# Patient Record
Sex: Male | Born: 1975 | Race: Black or African American | Hispanic: No | Marital: Single | Smoking: Current every day smoker
Health system: Southern US, Community
[De-identification: ages and names within clinical notes are randomized; demographics above are authoritative.]

## PROBLEM LIST (undated history)

## (undated) DIAGNOSIS — S2231XA Fracture of one rib, right side, initial encounter for closed fracture: Secondary | ICD-10-CM

## (undated) DIAGNOSIS — R51 Headache: Secondary | ICD-10-CM

## (undated) DIAGNOSIS — G8929 Other chronic pain: Secondary | ICD-10-CM

## (undated) DIAGNOSIS — N200 Calculus of kidney: Secondary | ICD-10-CM

## (undated) HISTORY — DX: Calculus of kidney: N20.0

## (undated) HISTORY — DX: Other chronic pain: G89.29

## (undated) HISTORY — DX: Fracture of one rib, right side, initial encounter for closed fracture: S22.31XA

## (undated) HISTORY — DX: Headache: R51

---

## 1997-12-11 DIAGNOSIS — S2231XA Fracture of one rib, right side, initial encounter for closed fracture: Secondary | ICD-10-CM

## 1997-12-11 HISTORY — PX: PELVIC FRACTURE SURGERY: SHX119

## 1997-12-11 HISTORY — DX: Fracture of one rib, right side, initial encounter for closed fracture: S22.31XA

## 1998-07-17 ENCOUNTER — Inpatient Hospital Stay (HOSPITAL_COMMUNITY): Admission: EM | Admit: 1998-07-17 | Discharge: 1998-08-04 | Payer: Self-pay | Admitting: Emergency Medicine

## 1998-07-27 ENCOUNTER — Encounter: Payer: Self-pay | Admitting: Pulmonary Disease

## 1998-07-29 ENCOUNTER — Encounter: Payer: Self-pay | Admitting: Surgery

## 1998-08-04 ENCOUNTER — Inpatient Hospital Stay
Admission: EM | Admit: 1998-08-04 | Discharge: 1998-08-13 | Payer: Self-pay | Admitting: Physical Medicine and Rehabilitation

## 1998-08-06 ENCOUNTER — Encounter: Payer: Self-pay | Admitting: Physical Medicine and Rehabilitation

## 1998-08-07 ENCOUNTER — Encounter: Payer: Self-pay | Admitting: Orthopedic Surgery

## 1998-08-13 ENCOUNTER — Inpatient Hospital Stay (HOSPITAL_COMMUNITY)
Admission: RE | Admit: 1998-08-13 | Discharge: 1998-08-27 | Payer: Self-pay | Admitting: Physical Medicine and Rehabilitation

## 1998-09-06 ENCOUNTER — Encounter (HOSPITAL_COMMUNITY): Admission: RE | Admit: 1998-09-06 | Discharge: 1998-12-05 | Payer: Self-pay | Admitting: *Deleted

## 1998-09-13 ENCOUNTER — Encounter: Admission: RE | Admit: 1998-09-13 | Discharge: 1998-12-12 | Payer: Self-pay | Admitting: Orthopedic Surgery

## 2000-08-02 ENCOUNTER — Emergency Department (HOSPITAL_COMMUNITY): Admission: EM | Admit: 2000-08-02 | Discharge: 2000-08-02 | Payer: Self-pay | Admitting: Emergency Medicine

## 2002-02-04 ENCOUNTER — Other Ambulatory Visit: Admission: RE | Admit: 2002-02-04 | Discharge: 2002-02-04 | Payer: Self-pay | Admitting: General Surgery

## 2003-02-09 ENCOUNTER — Encounter: Payer: Self-pay | Admitting: *Deleted

## 2003-02-09 ENCOUNTER — Emergency Department (HOSPITAL_COMMUNITY): Admission: EM | Admit: 2003-02-09 | Discharge: 2003-02-09 | Payer: Self-pay | Admitting: *Deleted

## 2003-03-01 ENCOUNTER — Emergency Department (HOSPITAL_COMMUNITY): Admission: EM | Admit: 2003-03-01 | Discharge: 2003-03-01 | Payer: Self-pay | Admitting: Emergency Medicine

## 2003-03-21 ENCOUNTER — Emergency Department (HOSPITAL_COMMUNITY): Admission: EM | Admit: 2003-03-21 | Discharge: 2003-03-21 | Payer: Self-pay | Admitting: Emergency Medicine

## 2005-08-28 ENCOUNTER — Emergency Department (HOSPITAL_COMMUNITY): Admission: EM | Admit: 2005-08-28 | Discharge: 2005-08-28 | Payer: Self-pay | Admitting: Emergency Medicine

## 2006-04-11 ENCOUNTER — Encounter: Payer: Self-pay | Admitting: Surgery

## 2007-02-05 ENCOUNTER — Emergency Department (HOSPITAL_COMMUNITY): Admission: EM | Admit: 2007-02-05 | Discharge: 2007-02-05 | Payer: Self-pay | Admitting: Emergency Medicine

## 2009-10-10 ENCOUNTER — Emergency Department (HOSPITAL_COMMUNITY): Admission: EM | Admit: 2009-10-10 | Discharge: 2009-10-11 | Payer: Self-pay | Admitting: Emergency Medicine

## 2010-05-10 ENCOUNTER — Emergency Department (HOSPITAL_COMMUNITY): Admission: EM | Admit: 2010-05-10 | Discharge: 2010-05-10 | Payer: Self-pay | Admitting: Emergency Medicine

## 2011-02-27 LAB — BASIC METABOLIC PANEL
CO2: 30 mEq/L (ref 19–32)
Calcium: 9.5 mg/dL (ref 8.4–10.5)
GFR calc Af Amer: >60 mL/min (ref >60)
GFR calc non Af Amer: 57 mL/min — ABNORMAL LOW (ref >60)
Sodium: 143 mEq/L (ref 135–145)

## 2011-02-27 LAB — URINALYSIS, ROUTINE W REFLEX MICROSCOPIC
Glucose, UA: NEGATIVE mg/dL
Hgb urine dipstick: NEGATIVE
Specific Gravity, Urine: 1.025 (ref 1.005–1.030)

## 2011-02-27 LAB — CBC
Hemoglobin: 14.1 g/dL (ref 13.0–17.0)
RBC: 4.96 MIL/uL (ref 4.22–5.81)
WBC: 6.5 10*3/uL (ref 4.0–10.5)

## 2011-02-27 LAB — DIFFERENTIAL
Lymphocytes Relative: 37 % (ref 12–46)
Lymphs Abs: 2.4 10*3/uL (ref 0.7–4.0)
Monocytes Absolute: 0.6 10*3/uL (ref 0.1–1.0)
Monocytes Relative: 9 % (ref 3–12)
Neutro Abs: 3.3 10*3/uL (ref 1.7–7.7)

## 2012-02-22 ENCOUNTER — Emergency Department (HOSPITAL_COMMUNITY)
Admission: EM | Admit: 2012-02-22 | Discharge: 2012-02-22 | Disposition: A | Discharge status: Home / Self Care | Payer: Self-pay | Attending: Emergency Medicine | Admitting: Emergency Medicine

## 2012-02-22 ENCOUNTER — Encounter (HOSPITAL_COMMUNITY): Payer: Self-pay | Admitting: *Deleted

## 2012-02-22 DIAGNOSIS — F411 Generalized anxiety disorder: Secondary | ICD-10-CM | POA: Insufficient documentation

## 2012-02-22 DIAGNOSIS — F17200 Nicotine dependence, unspecified, uncomplicated: Secondary | ICD-10-CM | POA: Insufficient documentation

## 2012-02-22 DIAGNOSIS — R21 Rash and other nonspecific skin eruption: Secondary | ICD-10-CM | POA: Insufficient documentation

## 2012-02-22 DIAGNOSIS — Z833 Family history of diabetes mellitus: Secondary | ICD-10-CM | POA: Insufficient documentation

## 2012-02-22 MED ORDER — HYDROXYZINE HCL 25 MG PO TABS: 25.0000 mg | ORAL_TABLET | Freq: Four times a day (QID) | ORAL | Status: AC

## 2012-02-22 NOTE — ED Notes (Signed)
See triage note and EDP documentation. Pt reports rash when he gets hot, excited, anxious and other things that makes him hot. Rash goes away when he cools off.

## 2012-02-22 NOTE — ED Notes (Signed)
Itching for 2 mos when he feels hot. Skin gets red and starts to itch.

## 2012-02-23 NOTE — ED Provider Notes (Signed)
History     CSN: [id number]  Arrival date & time 02/22/12  1637   First MD Initiated Contact with Patient 02/22/12 1652      Chief Complaint  Patient presents with  . Pruritis    (Consider location/radiation/quality/duration/timing/severity/associated sxs/prior treatment) HPI Comments: Patient complains of episodes of intermittent red rash and itching to the skin. He states that the episodes occur when he becomes hot or anxious.  He states that when he calms down or cool off the symptoms will resolve spontaneously.  He states that he has tried applying oatmeal baths and lotion without relief. He denies rash at present.  He also denies any recent illness , swelling or fever.  Patient is a 36 y.o. male presenting with rash. The history is provided by the patient. No language interpreter was used.  Rash  This is a chronic problem. The current episode started more than 1 week ago. The problem has not changed since onset.Associated with: Anxiety and stress. There has been no fever. The rash is present on the left lower leg, right lower leg, torso, left arm and right arm. The patient is experiencing no pain. The pain has been intermittent since onset. Associated symptoms include itching. Pertinent negatives include no blisters, no pain and no weeping. He has tried nothing for the symptoms.    History reviewed. No pertinent past medical history.  Past Surgical History  Procedure Date  . Pelvic fracture surgery     Family History  Problem Relation Age of Onset  . Diabetes Mother     History  Substance Use Topics  . Smoking status: Current Everyday Smoker  . Smokeless tobacco: Not on file  . Alcohol Use: Yes      Review of Systems  Constitutional: Negative for fever, activity change and appetite change.  HENT: Negative for facial swelling.   Musculoskeletal: Negative for joint swelling and gait problem.  Skin: Positive for itching and rash.  Neurological: Negative for dizziness  and numbness.  Hematological: Negative for adenopathy. Does not bruise/bleed easily.  All other systems reviewed and are negative.    Allergies  Review of patient's allergies indicates no known allergies.  Home Medications   Current Outpatient Rx  Name Route Sig Dispense Refill  . HYDROXYZINE HCL 25 MG PO TABS Oral Take 1 tablet (25 mg total) by mouth every 6 (six) hours. As needed 20 tablet 0    Pulse 96  Temp(Src) 98.5 F (36.9 C) (Oral)  Resp 20  Ht 5\' 9"  (1.753 m)  Wt 200 lb (90.719 kg)  BMI 29.53 kg/m2  SpO2 99%  Physical Exam  Nursing note and vitals reviewed. Constitutional: He is oriented to person, place, and time. He appears well-developed and well-nourished. No distress.  HENT:  Head: Normocephalic and atraumatic.  Mouth/Throat: Oropharynx is clear and moist.  Neck: Normal range of motion. Neck supple.  Cardiovascular: Normal rate, regular rhythm, normal heart sounds and intact distal pulses.   No murmur heard. Pulmonary/Chest: Effort normal and breath sounds normal. No respiratory distress.  Musculoskeletal: Normal range of motion. He exhibits no edema and no tenderness.  Lymphadenopathy:    He has no cervical adenopathy.  Neurological: He is alert and oriented to person, place, and time. He exhibits normal muscle tone. Coordination normal.  Skin: Skin is warm and dry. No rash noted.       No itching or rash at present. Skin exam appears normal.    ED Course  Procedures (including critical care time)  1. Rash and nonspecific skin eruption       MDM    Patient complains of intermittent erythematous rash to most of his body but does not have any symptoms at present. Patient does not appear in any acute distress, he does not appear anxious at this time. Vitals are stable.  His symptoms are likely stress related. He agrees to followup with the health department or to return here if his symptoms worsen.      Kyrsten Deleeuw L. [place], [place] 02/23/12 0147

## 2012-02-24 NOTE — ED Provider Notes (Signed)
Medical screening examination/treatment/procedure(s) were performed by non-physician practitioner and as supervising physician I was immediately available for consultation/collaboration.   Lorin Hauck L Maurisa Tesmer, MD 02/24/12 0726 

## 2012-03-11 ENCOUNTER — Emergency Department (HOSPITAL_COMMUNITY)
Admission: EM | Admit: 2012-03-11 | Discharge: 2012-03-11 | Disposition: A | Discharge status: Home / Self Care | Payer: Self-pay | Attending: Emergency Medicine | Admitting: Emergency Medicine

## 2012-03-11 ENCOUNTER — Encounter (HOSPITAL_COMMUNITY): Payer: Self-pay | Admitting: *Deleted

## 2012-03-11 DIAGNOSIS — L299 Pruritus, unspecified: Secondary | ICD-10-CM | POA: Insufficient documentation

## 2012-03-11 DIAGNOSIS — R21 Rash and other nonspecific skin eruption: Secondary | ICD-10-CM | POA: Insufficient documentation

## 2012-03-11 MED ORDER — LORAZEPAM 1 MG PO TABS: 1.0000 mg | ORAL_TABLET | Freq: Three times a day (TID) | ORAL | Status: AC | PRN

## 2012-03-11 MED ORDER — PEPCID 20 MG PO TABS: 20.0000 mg | ORAL_TABLET | Freq: Two times a day (BID) | ORAL | Status: DC

## 2012-03-11 NOTE — ED Notes (Signed)
Pt has no rash at this time.  Occurs only when hot.

## 2012-03-11 NOTE — ED Notes (Signed)
Since December he has had a rash all over his body whenever he gets hot.  He has been seen in one of the eds for the same

## 2012-03-11 NOTE — Discharge Instructions (Signed)
Mr. Bradley Garner continue taking her Zyrtec. Add Pepcid 20 mg a day. He can also try taking the Ativan but I would not suggest driving with this medication. It sounds like you're having heat rashes to me which could be caused by anxiety or the temperature in the room.  The PCP from the list below or one of your own.  This could be some kind of hormone imbalance as well but we do not check hormone levels in the ER. The itching continues without relief he can also take Benadryl which is over-the-counter.

## 2012-03-11 NOTE — ED Provider Notes (Signed)
History     CSN: [id number]  Arrival date & time 03/11/12  1810   None     Chief Complaint  Patient presents with  . Pruritis    (Consider location/radiation/quality/duration/timing/severity/associated sxs/prior treatment) Patient is a 36 y.o. male presenting with rash. The history is provided by the patient. No language interpreter was used.  Rash  This is a recurrent problem. The current episode started 3 to 5 hours ago. The problem has not changed since onset.Associated with: body temp. There has been no fever. The rash is present on the torso, trunk, right arm and left arm. The pain is at a severity of 0/10. The patient is experiencing no pain. The pain has been intermittent since onset. Associated symptoms include itching. Pertinent negatives include no blisters and no pain. He has tried antihistamines and OTC analgesics for the symptoms.  States that since December 2012 he has been getting a rash to his trunk and upper extremities whenever he get over heated. States that he can walk outside and cool down and the rash and itching goes away. No rash presently.  Went to [facility] and was given antihistamine atarax with no improvement.  No pcp. No pmh.  Etoh and smoker. No rash presently.  Affiliated with stress.  History reviewed. No pertinent past medical history.  Past Surgical History  Procedure Date  . Pelvic fracture surgery     Family History  Problem Relation Age of Onset  . Diabetes Mother     History  Substance Use Topics  . Smoking status: Current Everyday Smoker  . Smokeless tobacco: Not on file  . Alcohol Use: Yes      Review of Systems  Constitutional: Negative.  Negative for fever.  HENT: Negative.   Eyes: Negative.  Negative for photophobia and itching.  Respiratory: Negative.   Cardiovascular: Negative.   Gastrointestinal: Negative.  Negative for nausea, vomiting and abdominal pain.  Genitourinary: Negative for difficulty urinating.  Skin: Positive  for itching and rash.  Neurological: Negative.  Negative for dizziness, weakness, light-headedness and headaches.  Psychiatric/Behavioral: Negative for suicidal ideas. The patient is nervous/anxious.     Allergies  Review of patient's allergies indicates no known allergies.  Home Medications   Current Outpatient Rx  Name Route Sig Dispense Refill  . ADULT MULTIVITAMIN W/MINERALS CH Oral Take 1 tablet by mouth daily.      BP 129/79  Pulse 109  Temp(Src) 98.5 F (36.9 C) (Oral)  Resp 18  SpO2 99%  Physical Exam  Nursing note and vitals reviewed. Constitutional: He is oriented to person, place, and time. He appears well-developed and well-nourished.  HENT:  Head: Normocephalic.  Eyes: Conjunctivae and EOM are normal. Pupils are equal, round, and reactive to light.  Neck: Normal range of motion. Neck supple.  Cardiovascular: Normal rate.   Pulmonary/Chest: Effort normal.  Abdominal: Soft.  Musculoskeletal: Normal range of motion.  Neurological: He is alert and oriented to person, place, and time.  Skin: Skin is warm and dry. No rash noted.  Psychiatric: He has a normal mood and affect.    ED Course  Procedures (including critical care time)  Labs Reviewed - No data to display No results found.   No diagnosis found.    MDM  Intermittant rash when his body is "hot" or "stressed out".   Continue antihistamine and add pepcid.  Ativan prn anxiety.  Get a pcp to possible check hormone levels.  List provided.  Jethro Bastos, NP 03/12/12 1742

## 2012-03-13 NOTE — ED Provider Notes (Signed)
Medical screening examination/treatment/procedure(s) were performed by non-physician practitioner and as supervising physician I was immediately available for consultation/collaboration.   Zhyon Antenucci W. Layal Javid, MD 03/13/12 0250 

## 2013-04-27 ENCOUNTER — Encounter (HOSPITAL_COMMUNITY): Payer: Self-pay | Admitting: *Deleted

## 2013-04-27 ENCOUNTER — Emergency Department (HOSPITAL_COMMUNITY)
Admission: EM | Admit: 2013-04-27 | Discharge: 2013-04-27 | Disposition: A | Discharge status: Home / Self Care | Payer: Self-pay | Attending: Emergency Medicine | Admitting: Emergency Medicine

## 2013-04-27 DIAGNOSIS — L0501 Pilonidal cyst with abscess: Secondary | ICD-10-CM | POA: Insufficient documentation

## 2013-04-27 DIAGNOSIS — F17200 Nicotine dependence, unspecified, uncomplicated: Secondary | ICD-10-CM | POA: Insufficient documentation

## 2013-04-27 LAB — GLUCOSE, CAPILLARY: Glucose-Capillary: 97 mg/dL (ref 70–99)

## 2013-04-27 MED ORDER — SULFAMETHOXAZOLE-TRIMETHOPRIM 800-160 MG PO TABS: 1.0000 | ORAL_TABLET | Freq: Two times a day (BID) | ORAL | Status: DC

## 2013-04-27 NOTE — ED Notes (Signed)
[  pt c/o abscess to buttock area, states that it started Wednesday, states "it busted on Friday".

## 2013-04-27 NOTE — ED Notes (Signed)
POCT CBG Result: 97

## 2013-04-27 NOTE — ED Provider Notes (Signed)
History     CSN: [id number]  Arrival date & time 04/27/13  7829   First MD Initiated Contact with Patient 04/27/13 [phone]      Chief Complaint  Patient presents with  . Abscess    (Consider location/radiation/quality/duration/timing/severity/associated sxs/prior treatment) Patient is a 37 y.o. male presenting with abscess. The history is provided by the patient.  Abscess Location:  Ano-genital Ano-genital abscess location:  Gluteal cleft Size:  2 cm Abscess quality: draining, induration and redness   Abscess quality: no fluctuance and not painful   Red streaking: no   Duration:  3 days Progression:  Improving Chronicity:  Recurrent (He reports this is his 4th abscess at this site since last fall) Context: not diabetes and not skin injury   Relieved by:  Warm water soaks (boil ease) Exacerbated by: pressure. Ineffective treatments:  None tried Associated symptoms: no fatigue, no fever, no headaches and no nausea   Risk factors: no family hx of MRSA   Risk factors comment:  Family history of DM,  pt concerned he may have DM.  Denies polyuria, polydipsia.   History reviewed. No pertinent past medical history.  Past Surgical History  Procedure Laterality Date  . Pelvic fracture surgery      Family History  Problem Relation Age of Onset  . Diabetes Mother     History  Substance Use Topics  . Smoking status: Current Every Day Smoker  . Smokeless tobacco: Not on file  . Alcohol Use: Yes      Review of Systems  Constitutional: Negative for fever and fatigue.  HENT: Negative for congestion, sore throat and neck pain.   Eyes: Negative.   Respiratory: Negative for chest tightness and shortness of breath.   Cardiovascular: Negative for chest pain.  Gastrointestinal: Negative for nausea and abdominal pain.  Endocrine: Negative for polydipsia and polyuria.  Genitourinary: Negative.  Negative for frequency.  Musculoskeletal: Negative for joint swelling and arthralgias.   Skin: Positive for color change. Negative for rash.  Neurological: Negative for dizziness, weakness, light-headedness, numbness and headaches.  Psychiatric/Behavioral: Negative.     Allergies  Review of patient's allergies indicates no known allergies.  Home Medications   Current Outpatient Rx  Name  Route  Sig  Dispense  Refill  . Multiple Vitamin (MULITIVITAMIN WITH MINERALS) TABS   Oral   Take 1 tablet by mouth daily.         Marland Kitchen EXPIRED: PEPCID 20 MG tablet   Oral   Take 1 tablet (20 mg total) by mouth 2 (two) times daily.   30 tablet   0     Dispense as written.   . sulfamethoxazole-trimethoprim (SEPTRA DS) 800-160 MG per tablet   Oral   Take 1 tablet by mouth every 12 (twelve) hours.   20 tablet   0     BP 122/82  Pulse 79  Temp(Src) 98.8 F (37.1 C) (Oral)  Resp 16  SpO2 98%  Physical Exam  Constitutional: He is oriented to person, place, and time. He appears well-developed and well-nourished.  HENT:  Head: Normocephalic.  Cardiovascular: Normal rate.   Pulmonary/Chest: Effort normal.  Neurological: He is alert and oriented to person, place, and time. No sensory deficit.  Skin:  2 cm area of induration right upper buttock just left midline. Central punctum which is draining.  2 cm surrounding erythema.  No fluctuance.  Minimal tenderness.    ED Course  Procedures (including critical care time)  Labs Reviewed - No data to  display No results found.   1. Pilonidal abscess       MDM  Discussed I &D versus continued warm soaks in addition to antibiotic use.  Patient has chosen to continue warm soaks.  Also discussed probable need for general surgeon given the multiple abscesses at this site.  He was given a referral to Dr. Leticia Penna for this.  He was prescribed Bactrim and encouraged to continue warm Epsom salt soaks.  Return here for any worsened symptoms in the interim.        Burgess Amor, PA-C 04/27/13 [phone]

## 2013-04-27 NOTE — ED Notes (Signed)
Pt reports has abscess on buttocks since Thursday.  Reports used "boil ease" and it "burst" Friday.

## 2013-04-27 NOTE — ED Notes (Signed)
Pt alert & oriented x4, stable gait. Patient given discharge instructions, paperwork & prescription(s). Patient  instructed to stop at the registration desk to finish any additional paperwork. Patient verbalized understanding. Pt left department w/ no further questions. 

## 2013-04-27 NOTE — ED Provider Notes (Signed)
Medical screening examination/treatment/procedure(s) were performed by non-physician practitioner and as supervising physician I was immediately available for consultation/collaboration.   Sipriano Fendley W Devina Bezold, MD 04/27/13 1512 

## 2013-11-03 ENCOUNTER — Emergency Department (HOSPITAL_COMMUNITY)
Admission: EM | Admit: 2013-11-03 | Discharge: 2013-11-03 | Disposition: A | Discharge status: Home / Self Care | Payer: Self-pay | Attending: Emergency Medicine | Admitting: Emergency Medicine

## 2013-11-03 ENCOUNTER — Encounter (HOSPITAL_COMMUNITY): Payer: Self-pay | Admitting: Emergency Medicine

## 2013-11-03 DIAGNOSIS — L0501 Pilonidal cyst with abscess: Secondary | ICD-10-CM | POA: Insufficient documentation

## 2013-11-03 DIAGNOSIS — F17200 Nicotine dependence, unspecified, uncomplicated: Secondary | ICD-10-CM | POA: Insufficient documentation

## 2013-11-03 MED ORDER — KETOROLAC TROMETHAMINE 10 MG PO TABS
10.0000 mg | ORAL_TABLET | Freq: Once | ORAL | Status: AC
  Administered 2013-11-03: 10 mg via ORAL
  Filled 2013-11-03: qty 1

## 2013-11-03 MED ORDER — OXYCODONE-ACETAMINOPHEN 5-325 MG PO TABS: 1.0000 | ORAL_TABLET | Freq: Four times a day (QID) | ORAL | Status: DC | PRN

## 2013-11-03 MED ORDER — AMOXICILLIN-POT CLAVULANATE 875-125 MG PO TABS
1.0000 | ORAL_TABLET | Freq: Once | ORAL | Status: AC
  Administered 2013-11-03: 1 via ORAL
  Filled 2013-11-03: qty 1

## 2013-11-03 MED ORDER — SULFAMETHOXAZOLE-TRIMETHOPRIM 800-160 MG PO TABS: 1.0000 | ORAL_TABLET | Freq: Two times a day (BID) | ORAL | Status: DC

## 2013-11-03 MED ORDER — ONDANSETRON HCL 4 MG PO TABS
4.0000 mg | ORAL_TABLET | Freq: Once | ORAL | Status: AC
  Administered 2013-11-03: 4 mg via ORAL
  Filled 2013-11-03: qty 1

## 2013-11-03 MED ORDER — OXYCODONE-ACETAMINOPHEN 5-325 MG PO TABS: ORAL_TABLET | ORAL | Status: DC

## 2013-11-03 MED ORDER — BUPIVACAINE HCL (PF) 0.25 % IJ SOLN
INTRAMUSCULAR | Status: AC
  Filled 2013-11-03: qty 30

## 2013-11-03 MED ORDER — SULFAMETHOXAZOLE-TMP DS 800-160 MG PO TABS
1.0000 | ORAL_TABLET | Freq: Once | ORAL | Status: AC
  Administered 2013-11-03: 1 via ORAL
  Filled 2013-11-03: qty 1

## 2013-11-03 MED ORDER — BUPIVACAINE-EPINEPHRINE PF 0.5-1:200000 % IJ SOLN
INTRAMUSCULAR | Status: AC
  Administered 2013-11-03: 150 mg
  Filled 2013-11-03: qty 10

## 2013-11-03 NOTE — ED Notes (Signed)
Abscess to buttock , has had similar abscess in past.

## 2013-11-03 NOTE — ED Provider Notes (Signed)
CSN: [id number]     Arrival date & time 11/03/13  1910 History   First MD Initiated Contact with Patient 11/03/13 2013     Chief Complaint  Patient presents with  . Abscess   (Consider location/radiation/quality/duration/timing/severity/associated sxs/prior Treatment) Patient is a 37 y.o. male presenting with abscess. The history is provided by the patient.  Abscess Location:  Ano-genital Ano-genital abscess location:  L buttock and R buttock Abscess quality: fluctuance, painful and warmth   Red streaking: no   Duration:  4 days Progression:  Worsening Pain details:    Quality:  Pressure and throbbing   Severity:  Moderate   Timing:  Constant   Progression:  Worsening Chronicity:  Recurrent Context: not diabetes and not immunosuppression   Relieved by:  Nothing Exacerbated by: sitting. Associated symptoms: no fever, no nausea and no vomiting   Risk factors: prior abscess     History reviewed. No pertinent past medical history. Past Surgical History  Procedure Laterality Date  . Pelvic fracture surgery     Family History  Problem Relation Age of Onset  . Diabetes Mother    History  Substance Use Topics  . Smoking status: Current Every Day Smoker  . Smokeless tobacco: Not on file  . Alcohol Use: Yes    Review of Systems  Constitutional: Negative for fever and activity change.       All ROS Neg except as noted in HPI  HENT: Negative for nosebleeds.   Eyes: Negative for photophobia and discharge.  Respiratory: Negative for cough, shortness of breath and wheezing.   Cardiovascular: Negative for chest pain and palpitations.  Gastrointestinal: Negative for nausea, vomiting, abdominal pain and blood in stool.  Genitourinary: Negative for dysuria, frequency and hematuria.  Musculoskeletal: Negative for arthralgias, back pain and neck pain.  Skin: Negative.   Neurological: Negative for dizziness, seizures and speech difficulty.  Psychiatric/Behavioral: Negative for  hallucinations and confusion.    Allergies  Review of patient's allergies indicates no known allergies.  Home Medications  No current outpatient prescriptions on file. BP 118/84  Pulse 84  Temp(Src) 98.8 F (37.1 C) (Oral)  Resp 20  Ht 5\' 9"  (1.753 m)  Wt 200 lb (90.719 kg)  BMI 29.52 kg/m2  SpO2 99% Physical Exam  Nursing note and vitals reviewed. Constitutional: He is oriented to person, place, and time. He appears well-developed and well-nourished.  Non-toxic appearance.  HENT:  Head: Normocephalic.  Right Ear: Tympanic membrane and external ear normal.  Left Ear: Tympanic membrane and external ear normal.  Eyes: EOM and lids are normal. Pupils are equal, round, and reactive to light.  Neck: Normal range of motion. Neck supple. Carotid bruit is not present.  Cardiovascular: Normal rate, regular rhythm, normal heart sounds, intact distal pulses and normal pulses.   Pulmonary/Chest: Breath sounds normal. No respiratory distress.  Abdominal: Soft. Bowel sounds are normal. There is no tenderness. There is no guarding.  Genitourinary:  Pt has abscess formation mostly of the right buttocks that extend to the gluteal fold. Painful to palpation.  Musculoskeletal: Normal range of motion.  Lymphadenopathy:       Head (right side): No submandibular adenopathy present.       Head (left side): No submandibular adenopathy present.    He has no cervical adenopathy.  Neurological: He is alert and oriented to person, place, and time. He has normal strength. No cranial nerve deficit or sensory deficit.  Skin: Skin is warm and dry.  Psychiatric: He has a normal  mood and affect. His speech is normal.    ED Course  INCISION AND DRAINAGE Date/Time: 11/03/2013 8:46 PM Performed by: Kathie Dike Authorized by: Kathie Dike Consent: Verbal consent obtained. Risks and benefits: risks, benefits and alternatives were discussed Consent given by: patient Patient understanding: patient  states understanding of the procedure being performed Patient identity confirmed: arm band Time out: Immediately prior to procedure a "time out" was called to verify the correct patient, procedure, equipment, support staff and site/side marked as required. Type: abscess Body area: anogenital Location details: pilonidal Anesthesia: local infiltration Local anesthetic: bupivacaine 0.5% with epinephrine Patient sedated: no Scalpel size: 11 Incision type: single straight Complexity: simple Drainage: purulent Drainage amount: copious Wound treatment: wound left open Patient tolerance: Patient tolerated the procedure well with no immediate complications.   (including critical care time) Labs Review Labs Reviewed - No data to display Imaging Review No results found.  EKG Interpretation   None       MDM  No diagnosis found. *I have reviewed nursing notes, vital signs, and all appropriate lab and imaging results for this patient.**  Abscess of the buttock was drained. Culture sent to the lab. Oxycodone and septra given to the patient. Pt to return if any changes or problem.  Kathie Dike, PA-C 11/07/13 1806

## 2013-11-06 LAB — CULTURE, ROUTINE-ABSCESS

## 2013-11-08 NOTE — ED Provider Notes (Signed)
Medical screening examination/treatment/procedure(s) were performed by non-physician practitioner and as supervising physician I was immediately available for consultation/collaboration.     Geoffery Lyons, MD 11/08/13 [phone]

## 2013-11-10 MED FILL — Oxycodone w/ Acetaminophen Tab 5-325 MG: ORAL | Qty: 6 | Status: AC

## 2014-01-04 ENCOUNTER — Emergency Department (HOSPITAL_COMMUNITY)
Admission: EM | Admit: 2014-01-04 | Discharge: 2014-01-04 | Disposition: A | Discharge status: Home / Self Care | Payer: Self-pay | Attending: Emergency Medicine | Admitting: Emergency Medicine

## 2014-01-04 ENCOUNTER — Encounter (HOSPITAL_COMMUNITY): Payer: Self-pay | Admitting: Emergency Medicine

## 2014-01-04 ENCOUNTER — Emergency Department (HOSPITAL_COMMUNITY): Payer: Self-pay

## 2014-01-04 DIAGNOSIS — Z9889 Other specified postprocedural states: Secondary | ICD-10-CM | POA: Insufficient documentation

## 2014-01-04 DIAGNOSIS — R21 Rash and other nonspecific skin eruption: Secondary | ICD-10-CM | POA: Insufficient documentation

## 2014-01-04 DIAGNOSIS — B369 Superficial mycosis, unspecified: Secondary | ICD-10-CM

## 2014-01-04 DIAGNOSIS — R109 Unspecified abdominal pain: Secondary | ICD-10-CM

## 2014-01-04 DIAGNOSIS — N508 Other specified disorders of male genital organs: Secondary | ICD-10-CM | POA: Insufficient documentation

## 2014-01-04 DIAGNOSIS — Z792 Long term (current) use of antibiotics: Secondary | ICD-10-CM | POA: Insufficient documentation

## 2014-01-04 DIAGNOSIS — R748 Abnormal levels of other serum enzymes: Secondary | ICD-10-CM | POA: Insufficient documentation

## 2014-01-04 DIAGNOSIS — F17200 Nicotine dependence, unspecified, uncomplicated: Secondary | ICD-10-CM | POA: Insufficient documentation

## 2014-01-04 DIAGNOSIS — Z79899 Other long term (current) drug therapy: Secondary | ICD-10-CM | POA: Insufficient documentation

## 2014-01-04 DIAGNOSIS — R1033 Periumbilical pain: Secondary | ICD-10-CM | POA: Insufficient documentation

## 2014-01-04 DIAGNOSIS — K5900 Constipation, unspecified: Secondary | ICD-10-CM | POA: Insufficient documentation

## 2014-01-04 LAB — URINALYSIS, ROUTINE W REFLEX MICROSCOPIC
BILIRUBIN URINE: NEGATIVE
GLUCOSE, UA: NEGATIVE mg/dL
KETONES UR: NEGATIVE mg/dL
Leukocytes, UA: NEGATIVE
Nitrite: NEGATIVE
Specific Gravity, Urine: >1.03 — ABNORMAL HIGH (ref 1.005–1.030)
Urobilinogen, UA: 0.2 mg/dL (ref 0.0–1.0)
pH: 6 (ref 5.0–8.0)

## 2014-01-04 LAB — URINE MICROSCOPIC-ADD ON

## 2014-01-04 LAB — CBC WITH DIFFERENTIAL/PLATELET
BASOS ABS: 0 10*3/uL (ref 0.0–0.1)
Basophils Relative: 0 % (ref 0–1)
Eosinophils Absolute: 0.1 10*3/uL (ref 0.0–0.7)
Eosinophils Relative: 2 % (ref 0–5)
HEMATOCRIT: 43 % (ref 39.0–52.0)
Hemoglobin: 14.3 g/dL (ref 13.0–17.0)
LYMPHS ABS: 1.7 10*3/uL (ref 0.7–4.0)
LYMPHS PCT: 30 % (ref 12–46)
MCH: 28.4 pg (ref 26.0–34.0)
MCHC: 33.3 g/dL (ref 30.0–36.0)
MCV: 85.3 fL (ref 78.0–100.0)
MONO ABS: 0.4 10*3/uL (ref 0.1–1.0)
Monocytes Relative: 7 % (ref 3–12)
NEUTROS ABS: 3.4 10*3/uL (ref 1.7–7.7)
Neutrophils Relative %: 60 % (ref 43–77)
PLATELETS: 232 10*3/uL (ref 150–400)
RBC: 5.04 MIL/uL (ref 4.22–5.81)
RDW: 13.4 % (ref 11.5–15.5)
WBC: 5.7 10*3/uL (ref 4.0–10.5)

## 2014-01-04 LAB — COMPREHENSIVE METABOLIC PANEL
ALT: 41 U/L (ref 0–53)
AST: 23 U/L (ref 0–37)
Albumin: 4.2 g/dL (ref 3.5–5.2)
Alkaline Phosphatase: 71 U/L (ref 39–117)
BILIRUBIN TOTAL: 0.2 mg/dL — AB (ref 0.3–1.2)
BUN: 21 mg/dL (ref 6–23)
CHLORIDE: 101 meq/L (ref 96–112)
CO2: 25 meq/L (ref 19–32)
Calcium: 9.4 mg/dL (ref 8.4–10.5)
Creatinine, Ser: 1.17 mg/dL (ref 0.50–1.35)
GFR calc Af Amer: 90 mL/min — ABNORMAL LOW (ref >90)
GFR, EST NON AFRICAN AMERICAN: 78 mL/min — AB (ref >90)
Glucose, Bld: 97 mg/dL (ref 70–99)
Potassium: 4 mEq/L (ref 3.7–5.3)
SODIUM: 138 meq/L (ref 137–147)
Total Protein: 8.1 g/dL (ref 6.0–8.3)

## 2014-01-04 LAB — LIPASE, BLOOD: Lipase: 763 U/L — ABNORMAL HIGH (ref 11–59)

## 2014-01-04 MED ORDER — IOHEXOL 300 MG/ML  SOLN
100.0000 mL | Freq: Once | INTRAMUSCULAR | Status: AC | PRN
  Administered 2014-01-04: 100 mL via INTRAVENOUS

## 2014-01-04 MED ORDER — CLOTRIMAZOLE-BETAMETHASONE 1-0.05 % EX CREA: TOPICAL_CREAM | CUTANEOUS | Status: DC

## 2014-01-04 MED ORDER — TRAMADOL HCL 50 MG PO TABS: 50.0000 mg | ORAL_TABLET | Freq: Four times a day (QID) | ORAL | Status: DC | PRN

## 2014-01-04 MED ORDER — IOHEXOL 300 MG/ML  SOLN
50.0000 mL | Freq: Once | INTRAMUSCULAR | Status: AC | PRN
  Administered 2014-01-04: 50 mL via INTRAVENOUS

## 2014-01-04 NOTE — Discharge Instructions (Signed)
No alcohol.  Follow up with your md in 3-4 days

## 2014-01-04 NOTE — ED Notes (Signed)
Pt c/o rash to scrotal area for the a week, c/o mid abd pain that started Friday, denies any n/v/d, fever, problems with urination, reports that he has been having irregular bowel movements since last year. Last bowel movement was Friday and normal per pt.

## 2014-01-04 NOTE — ED Provider Notes (Signed)
CSN: [id number]     Arrival date & time 01/04/14  1331 History   First MD Initiated Contact with Patient 01/04/14 1534     This chart was scribed for Benny Lennert, MD by Arlan Organ, ED Scribe. This patient was seen in room APA09/APA09 and the patient's care was started 3:40 PM.  Chief Complaint  Patient presents with  . Abdominal Pain   Patient is a 38 y.o. male presenting with abdominal pain. The history is provided by the patient. No language interpreter was used.  Abdominal Pain Pain location:  Periumbilical Pain radiates to:  Does not radiate Pain severity:  Mild Onset quality:  Gradual Duration:  2 days Timing:  Constant Progression:  Unchanged Chronicity:  New Relieved by:  OTC medications Worsened by:  Nothing tried Ineffective treatments:  None tried Associated symptoms: constipation   Associated symptoms: no chills, no cough, no diarrhea, no fever, no sore throat and no vomiting     HPI Comments: ELAD MACPHAIL is a 38 y.o. male who presents to the Emergency Department complaining of gradual onset, unchanged, mild abdominal pain that initially started 2 days ago. Pt states he recently noted an itchy rash to his scrotal area and rectal area with associated discharge that first appeared 1 week ago, and states the onset of his abdominal pain started after the rash appeared. He reports irregular bowel movements and constipation onset November 2014. He says he has tried ibuprofen for his abdominal pain with mild improvement. He denies fever, chills, cough, sore throat, diarrhea, or emesis at this time. Pt admits to occasional drinking, but says he only consumes alcohol about once a month.  History reviewed. No pertinent past medical history. Past Surgical History  Procedure Laterality Date  . Pelvic fracture surgery     Family History  Problem Relation Age of Onset  . Diabetes Mother    History  Substance Use Topics  . Smoking status: Current Every Day Smoker  .  Smokeless tobacco: Not on file  . Alcohol Use: Yes    Review of Systems  Constitutional: Negative for fever and chills.  HENT: Negative for sore throat.   Respiratory: Negative for cough.   Gastrointestinal: Positive for abdominal pain and constipation. Negative for vomiting and diarrhea.  Skin: Positive for rash (scrotum and rectum).  All other systems reviewed and are negative.    Allergies  Review of patient's allergies indicates no known allergies.  Home Medications   Current Outpatient Rx  Name  Route  Sig  Dispense  Refill  . oxyCODONE-acetaminophen (PERCOCET/ROXICET) 5-325 MG per tablet      1 or 2 po q6h prn pain   6 tablet   0   . oxyCODONE-acetaminophen (PERCOCET/ROXICET) 5-325 MG per tablet   Oral   Take 1 tablet by mouth every 6 (six) hours as needed for severe pain.   15 tablet   0   . sulfamethoxazole-trimethoprim (SEPTRA DS) 800-160 MG per tablet   Oral   Take 1 tablet by mouth 2 (two) times daily.   14 tablet   0    Triage Vitals: BP 137/81  Pulse 110  Temp(Src) 98.1 F (36.7 C) (Oral)  Resp 16  SpO2 98%  Physical Exam  Nursing note and vitals reviewed. Constitutional: He is oriented to person, place, and time. He appears well-developed and well-nourished.  HENT:  Head: Normocephalic.  Eyes: EOM are normal.  Neck: Normal range of motion.  Cardiovascular: Normal rate and regular rhythm.  Pulmonary/Chest: Effort normal.  Abdominal: He exhibits no distension. There is tenderness.  Minimal tenderness to periumbilical region  Musculoskeletal: Normal range of motion.  Neurological: He is alert and oriented to person, place, and time.  Skin: Rash noted.  Confluent rash to scrotum and anus 1 cm excoriation superior to rectum  Psychiatric: He has a normal mood and affect.    ED Course  Procedures (including critical care time)  DIAGNOSTIC STUDIES: Oxygen Saturation is 98% on RA, Normal by my interpretation.    COORDINATION OF CARE: 3:39  PM- Will order DG abd and blood work. Discussed plan with pt at bedside and pt agreed to plan.     5:18 PM- Discussed test results with pt. Explained need for CT scan, and pt agreed to plan.  6:14 PM- Discussed normal CT results with pt. Will discharge pt, advised pt to follow up with PCP. Pt agreed to plan.  Labs Review Labs Reviewed - No data to display Imaging Review No results found.  EKG Interpretation   None       MDM  The chart was scribed for me under my direct supervision.  I personally performed the history, physical, and medical decision making and all procedures in the evaluation of this patient.Benny Lennert.   Dwain Huhn L Rivers Gassmann, MD 01/04/14 Rickey [id number]

## 2014-01-05 NOTE — ED Provider Notes (Signed)
Pharmacy called, pt can't afford the creams that were ordered (lotrisone). Advised to buy OTC cortisone cream and mix with athletes feet ointment.   Bradley AlbeIva Tikita Mabee, MD, Armando GangFACEP   Ward GivensIva L Delance Weide, MD 01/05/14 [phone]

## 2014-01-29 ENCOUNTER — Encounter: Payer: Self-pay | Admitting: Gastroenterology

## 2014-01-29 ENCOUNTER — Ambulatory Visit (INDEPENDENT_AMBULATORY_CARE_PROVIDER_SITE_OTHER): Payer: Self-pay | Admitting: Gastroenterology

## 2014-01-29 VITALS — BP 120/77 | HR 84 | Temp 98.3°F | Wt 201.2 lb

## 2014-01-29 DIAGNOSIS — K859 Acute pancreatitis without necrosis or infection, unspecified: Secondary | ICD-10-CM

## 2014-01-29 LAB — LIPASE: LIPASE: 98 U/L — AB (ref 0–75)

## 2014-01-29 NOTE — Assessment & Plan Note (Addendum)
ACUTE IDIOPATHIC SX IMPROVED BUT NOT COMPLETELY RESOLVED IN THE SETTING OF RECENT ETOH USE. DIFFERENTIAL DIAGNOSIS INCLUDES CHRONIC ETOH PANCREATITIS. BILIARY PANCREATITIS, AUTOIMMUNE PANCREATITIS, LESS LIKELY IDIOPATHIC.  STOP DRINKING ALCOHOL STOP SMOKING LOW FAT DIET RUQ U/S CHECK CELIAC SEROLOGIES/LIPASE DISCUSSED DIFFERENTIAL DIAGNOSIS WITH PT. EXPLAINED I DO NOT THINK HE HAS CROHN'S DISEASE. [facility] FOR AUTOIMMUNE PANCREATITIS AND EUS. OPV IN 3 MOS   TOTAL TIME: H&P, DISCUSSION OF DIFFERENTIAL AND PLAN-45 MINS.
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
## 2014-01-29 NOTE — Patient Instructions (Signed)
COMPLETE ULTRASOUND & LABS.  STOP DRINKING ALCOHOL.  STOP SMOKING.  FOLLOW A LOW FAT DIET. SEE INFO BELOW.  CONTINUE YOUR WEIGHT LOSS EFFORTS.  CHECK CELIAC SEROLOGIES/LIPASE.  GO TO [facility] FOR EVALUATION FOR AUTOIMMUNE PANCREATITIS AND ENDOSCOPIC ULTRASOUND OF THE PANCREAS.  FOLLOW UP IN 3 MOS.   Low-Fat Diet BREADS, CEREALS, PASTA, RICE, DRIED PEAS, AND BEANS These products are high in carbohydrates and most are low in fat. Therefore, they can be increased in the diet as substitutes for fatty foods. They too, however, contain calories and should not be eaten in excess. Cereals can be eaten for snacks as well as for breakfast.   FRUITS AND VEGETABLES It is good to eat fruits and vegetables. Besides being sources of fiber, both are rich in vitamins and some minerals. They help you get the daily allowances of these nutrients. Fruits and vegetables can be used for snacks and desserts.  MEATS Limit lean meat, chicken, [place], and fish to no more than 6 ounces per day. Beef, Pork, and Lamb Use lean cuts of beef, pork, and lamb. Lean cuts include:  Extra-lean ground beef.  Arm roast.  Sirloin tip.  Center-cut ham.  Round steak.  Loin chops.  Rump roast.  Tenderloin.  Trim all fat off the outside of meats before cooking. It is not necessary to severely decrease the intake of red meat, but lean choices should be made. Lean meat is rich in protein and contains a highly absorbable form of iron. Premenopausal women, in particular, should avoid reducing lean red meat because this could increase the risk for low red blood cells (iron-deficiency anemia).  Chicken and [place] These are good sources of protein. The fat of poultry can be reduced by removing the skin and underlying fat layers before cooking. Chicken and [place] can be substituted for lean red meat in the diet. Poultry should not be fried or covered with high-fat sauces. Fish and Shellfish Fish is a good source of protein.  Shellfish contain cholesterol, but they usually are low in saturated fatty acids. The preparation of fish is important. Like chicken and [place], they should not be fried or covered with high-fat sauces. EGGS Egg whites contain no fat or cholesterol. They can be eaten often. Try 1 to 2 egg whites instead of whole eggs in recipes or use egg substitutes that do not contain yolk. MILK AND DAIRY PRODUCTS Use skim or 1% milk instead of 2% or whole milk. Decrease whole milk, natural, and processed cheeses. Use nonfat or low-fat (2%) cottage cheese or low-fat cheeses made from vegetable oils. Choose nonfat or low-fat (1 to 2%) yogurt. Experiment with evaporated skim milk in recipes that call for heavy cream. Substitute low-fat yogurt or low-fat cottage cheese for sour cream in dips and salad dressings. Have at least 2 servings of low-fat dairy products, such as 2 glasses of skim (or 1%) milk each day to help get your daily calcium intake. FATS AND OILS Reduce the total intake of fats, especially saturated fat. Butterfat, lard, and beef fats are high in saturated fat and cholesterol. These should be avoided as much as possible. Vegetable fats do not contain cholesterol, but certain vegetable fats, such as coconut oil, palm oil, and palm kernel oil are very high in saturated fats. These should be limited. These fats are often used in bakery goods, processed foods, popcorn, oils, and nondairy creamers. Vegetable shortenings and some peanut butters contain hydrogenated oils, which are also saturated fats. Read the labels on these  foods and check for saturated vegetable oils. Unsaturated vegetable oils and fats do not raise blood cholesterol. However, they should be limited because they are fats and are high in calories. Total fat should still be limited to 30% of your daily caloric intake. Desirable liquid vegetable oils are corn oil, cottonseed oil, olive oil, canola oil, safflower oil, soybean oil, and sunflower oil.  Peanut oil is not as good, but small amounts are acceptable. Buy a heart-healthy tub margarine that has no partially hydrogenated oils in the ingredients. Mayonnaise and salad dressings often are made from unsaturated fats, but they should also be limited because of their high calorie and fat content. Seeds, nuts, peanut butter, olives, and avocados are high in fat, but the fat is mainly the unsaturated type. These foods should be limited mainly to avoid excess calories and fat. OTHER EATING TIPS Snacks  Most sweets should be limited as snacks. They tend to be rich in calories and fats, and their caloric content outweighs their nutritional value. Some good choices in snacks are graham crackers, melba toast, soda crackers, bagels (no egg), English muffins, fruits, and vegetables. These snacks are preferable to snack crackers, [place] fries, TORTILLA CHIPS, and POTATO chips. Popcorn should be air-popped or cooked in small amounts of liquid vegetable oil. Desserts Eat fruit, low-fat yogurt, and fruit ices instead of pastries, cake, and cookies. Sherbet, angel food cake, gelatin dessert, frozen low-fat yogurt, or other frozen products that do not contain saturated fat (pure fruit juice bars, frozen ice pops) are also acceptable.  COOKING METHODS Choose those methods that use little or no fat. They include: Poaching.  Braising.  Steaming.  Grilling.  Baking.  Stir-frying.  Broiling.  Microwaving.  Foods can be cooked in a nonstick pan without added fat, or use a nonfat cooking spray in regular cookware. Limit fried foods and avoid frying in saturated fat. Add moisture to lean meats by using water, broth, cooking wines, and other nonfat or low-fat sauces along with the cooking methods mentioned above. Soups and stews should be chilled after cooking. The fat that forms on top after a few hours in the refrigerator should be skimmed off. When preparing meals, avoid using excess salt. Salt can contribute to  raising blood pressure in some people.  EATING AWAY FROM HOME Order entres, potatoes, and vegetables without sauces or butter. When meat exceeds the size of a deck of cards (3 to 4 ounces), the rest can be taken home for another meal. Choose vegetable or fruit salads and ask for low-calorie salad dressings to be served on the side. Use dressings sparingly. Limit high-fat toppings, such as bacon, crumbled eggs, cheese, sunflower seeds, and olives. Ask for heart-healthy tub margarine instead of butter.

## 2014-01-30 ENCOUNTER — Other Ambulatory Visit: Payer: Self-pay | Admitting: Gastroenterology

## 2014-01-30 LAB — TISSUE TRANSGLUTAMINASE, IGA: Tissue Transglutaminase Ab, IgA: 3.7 U/mL (ref <20)

## 2014-01-30 NOTE — Progress Notes (Signed)
[facility] Cor[facility] Shel[facility]Watkins [facility] with Dr. Lorin PicketScott on May 18th 2015 at 2:30

## 2014-02-03 NOTE — Progress Notes (Signed)
Reminder in epic °

## 2014-02-04 NOTE — Progress Notes (Signed)
Cc to pcp °

## 2014-02-05 ENCOUNTER — Ambulatory Visit (HOSPITAL_COMMUNITY): Payer: Self-pay

## 2014-02-09 ENCOUNTER — Telehealth: Payer: Self-pay | Admitting: *Deleted

## 2014-02-09 NOTE — Telephone Encounter (Signed)
Pt states he had blood work done 2 weeks ago, pt would like to get his results. Please advise [phone] pt said he is at work until 5 [facility] if he don't answer and he will call back on his lunch break.

## 2014-02-10 NOTE — Telephone Encounter (Signed)
Routing to Dr. Darrick PennaFields for lab results.

## 2014-02-11 NOTE — Progress Notes (Addendum)
PLEASE CALL PT. His lipase is better(98) but not normal. AVOID ALCOHOL AND SMOKING. FOLLOW A LOW FAT DIET. Complete ultrasound. FOLLOW UP WITH [facility]. OPV W/ DR. Lanorris Kalisz IN JUN 2015.

## 2014-02-12 ENCOUNTER — Other Ambulatory Visit: Payer: Self-pay | Admitting: Gastroenterology

## 2014-02-12 ENCOUNTER — Ambulatory Visit (HOSPITAL_COMMUNITY)
Admission: RE | Admit: 2014-02-12 | Discharge: 2014-02-12 | Disposition: A | Discharge status: Home / Self Care | Payer: Self-pay | Source: Ambulatory Visit | Attending: Gastroenterology | Admitting: Gastroenterology

## 2014-02-12 DIAGNOSIS — Q4479 Other congenital malformations of liver: Secondary | ICD-10-CM | POA: Insufficient documentation

## 2014-02-12 DIAGNOSIS — Q447 Other congenital malformations of liver: Secondary | ICD-10-CM

## 2014-02-12 DIAGNOSIS — R1011 Right upper quadrant pain: Secondary | ICD-10-CM | POA: Insufficient documentation

## 2014-02-12 DIAGNOSIS — K859 Acute pancreatitis without necrosis or infection, unspecified: Secondary | ICD-10-CM

## 2014-02-12 DIAGNOSIS — Q441 Other congenital malformations of gallbladder: Secondary | ICD-10-CM | POA: Insufficient documentation

## 2014-02-12 DIAGNOSIS — K7689 Other specified diseases of liver: Secondary | ICD-10-CM | POA: Insufficient documentation

## 2014-02-12 DIAGNOSIS — Q445 Other congenital malformations of bile ducts: Secondary | ICD-10-CM

## 2014-02-12 NOTE — Progress Notes (Signed)
LMOM to call.
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
## 2014-02-13 NOTE — Progress Notes (Signed)
Reminder in epic °

## 2014-02-16 ENCOUNTER — Telehealth: Payer: Self-pay | Admitting: Gastroenterology

## 2014-02-16 NOTE — Telephone Encounter (Signed)
LMOM to call.

## 2014-02-16 NOTE — Telephone Encounter (Signed)
Called pt and informed of results and also of the info from Dr. Larae GroomsJacob's office. He took the phone numbers. appt time and address and all of the information and wrote it down.

## 2014-02-16 NOTE — Telephone Encounter (Signed)
Per Trula Orehristina at Dr. Hulen Shoutsutlaw's office at [phone], the patient is scheduled to see Dr. Dulce Sellarutlaw 02/23/14 at 4:30PM.  [address] [place], [place].  The patient will need to speak with the front [facility] at [facility] concerning ov setup prior to coming in.  [phone]  Routing to [facility]

## 2014-02-16 NOTE — Telephone Encounter (Signed)
PLEASE CALL PT. HIS ULTRASOUND SHOWS A DILATED BILE DUCT AND PANCREATITIS.   HE NEEDS AN EUS IN [place] BY DR. Dulce SellarUTLAW. I PREFER AN EVALUATION OF HIS BILE DUCT AND PANCREAS IN MAR 2015. HE MAY KEEP HIS APPT IN MAY AT [facility] FOR NOW.  STRICTLY AVOID ALCOHOL. STOP SMOKING.  STRICTLY FOLLOW A LOW FAT DIET.   OPV IN JUN 2015 E30 IDIOPATHIC PANCREATITIS

## 2014-02-17 NOTE — Telephone Encounter (Signed)
Reminder in epic °

## 2014-02-17 NOTE — Telephone Encounter (Signed)
Correction: Dr. Hulen Shoutsutlaw's office.

## 2014-02-23 NOTE — Telephone Encounter (Signed)
Pt was informed of results on 02/16/2014. See notes.

## 2014-02-23 NOTE — Telephone Encounter (Signed)
Patient can't see Dr. Dulce Sellarutlaw because he doesn't have the $124.50 they require prior to the patient seeing Dr. Dulce Sellarutlaw.  Can we refer you to Dr. Christella HartiganJacobs with [facility] because they do accept [facility].

## 2014-02-24 ENCOUNTER — Other Ambulatory Visit: Payer: Self-pay | Admitting: Gastroenterology

## 2014-02-24 DIAGNOSIS — K859 Acute pancreatitis without necrosis or infection, unspecified: Secondary | ICD-10-CM

## 2014-02-24 DIAGNOSIS — K838 Other specified diseases of biliary tract: Secondary | ICD-10-CM

## 2014-02-24 NOTE — Telephone Encounter (Signed)
Referral has been sent to Dr. Jacobs 

## 2014-02-24 NOTE — Telephone Encounter (Signed)
REVIEWED.  

## 2014-02-24 NOTE — Telephone Encounter (Signed)
REFER TO DR. Christella HartiganJACOBS.

## 2014-02-24 NOTE — Telephone Encounter (Signed)
Bradley Garner.  Patty, can you please set him up for upper EUS, radial +/- linear, ++MAC, EUS Thursday in mid to late April to allow for any acute pancreatic inflammation to subside prior to the test.   Sandi, we'll get this set up.

## 2014-02-25 ENCOUNTER — Telehealth: Payer: Self-pay | Admitting: Gastroenterology

## 2014-02-27 NOTE — Telephone Encounter (Signed)
Pt will be called closer to April to set up appt in mid to late April

## 2014-02-27 NOTE — Telephone Encounter (Signed)
We have already been working on this. See phone note from last week.

## 2014-03-08 ENCOUNTER — Emergency Department (HOSPITAL_COMMUNITY)
Admission: EM | Admit: 2014-03-08 | Discharge: 2014-03-09 | Disposition: A | Discharge status: Home / Self Care | Payer: Self-pay | Attending: Emergency Medicine | Admitting: Emergency Medicine

## 2014-03-08 ENCOUNTER — Encounter (HOSPITAL_COMMUNITY): Payer: Self-pay | Admitting: Emergency Medicine

## 2014-03-08 DIAGNOSIS — Z87442 Personal history of urinary calculi: Secondary | ICD-10-CM | POA: Insufficient documentation

## 2014-03-08 DIAGNOSIS — F17200 Nicotine dependence, unspecified, uncomplicated: Secondary | ICD-10-CM | POA: Insufficient documentation

## 2014-03-08 DIAGNOSIS — IMO0002 Reserved for concepts with insufficient information to code with codable children: Secondary | ICD-10-CM | POA: Insufficient documentation

## 2014-03-08 DIAGNOSIS — R21 Rash and other nonspecific skin eruption: Secondary | ICD-10-CM | POA: Insufficient documentation

## 2014-03-08 NOTE — ED Notes (Signed)
Patient states was seen at end of January and diagnosed with pancreatitis; patient states noticed rash middle of February.

## 2014-03-09 MED ORDER — PREDNISONE 20 MG PO TABS: 60.0000 mg | ORAL_TABLET | Freq: Every day | ORAL | Status: DC

## 2014-03-09 MED ORDER — PREDNISONE 10 MG PO TABS
60.0000 mg | ORAL_TABLET | Freq: Once | ORAL | Status: AC
  Administered 2014-03-09: 60 mg via ORAL
  Filled 2014-03-09 (×2): qty 1

## 2014-03-09 MED ORDER — LORAZEPAM 1 MG PO TABS: 1.0000 mg | ORAL_TABLET | Freq: Three times a day (TID) | ORAL | Status: DC | PRN

## 2014-03-09 MED ORDER — HYDROXYZINE PAMOATE 25 MG PO CAPS: 25.0000 mg | ORAL_CAPSULE | ORAL | Status: DC | PRN

## 2014-03-09 NOTE — ED Notes (Signed)
Peppery, itchy rash noted on entire body. Has been there for 6 weeks. Has tried topical benadryl cream with no relief of itching.
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
## 2014-03-09 NOTE — Discharge Instructions (Signed)
Take Pepcid or Zantac twice a day.  The cause of your rash is not clear. Please follow up with the dermatologist.  Prednisone tablets What is this medicine? PREDNISONE (PRED ni sone) is a corticosteroid. It is commonly used to treat inflammation of the skin, joints, lungs, and other organs. Common conditions treated include asthma, allergies, and arthritis. It is also used for other conditions, such as blood disorders and diseases of the adrenal glands. This medicine may be used for other purposes; ask your health care provider or pharmacist if you have questions. COMMON BRAND NAME(S): Deltasone, Predone, Sterapred DS, Sterapred What should I tell my health care provider before I take this medicine? They need to know if you have any of these conditions: -Cushing's syndrome -diabetes -glaucoma -heart disease -high blood pressure -infection (especially a virus infection such as chickenpox, cold sores, or herpes) -kidney disease -liver disease -mental illness -myasthenia gravis -osteoporosis -seizures -stomach or intestine problems -thyroid disease -an unusual or allergic reaction to lactose, prednisone, other medicines, foods, dyes, or preservatives -pregnant or trying to get pregnant -breast-feeding How should I use this medicine? Take this medicine by mouth with a glass of water. Follow the directions on the prescription label. Take this medicine with food. If you are taking this medicine once a day, take it in the morning. Do not take more medicine than you are told to take. Do not suddenly stop taking your medicine because you may develop a severe reaction. Your doctor will tell you how much medicine to take. If your doctor wants you to stop the medicine, the dose may be slowly lowered over time to avoid any side effects. Talk to your pediatrician regarding the use of this medicine in children. Special care may be needed. Overdosage: If you think you have taken too much of this  medicine contact a poison control center or emergency room at once. NOTE: This medicine is only for you. Do not share this medicine with others. What if I miss a dose? If you miss a dose, take it as soon as you can. If it is almost time for your next dose, talk to your doctor or health care professional. You may need to miss a dose or take an extra dose. Do not take double or extra doses without advice. What may interact with this medicine? Do not take this medicine with any of the following medications: -metyrapone -mifepristone This medicine may also interact with the following medications: -aminoglutethimide -amphotericin B -aspirin and aspirin-like medicines -barbiturates -certain medicines for diabetes, like glipizide or glyburide -cholestyramine -cholinesterase inhibitors -cyclosporine -digoxin -diuretics -ephedrine -male hormones, like estrogens and birth control pills -isoniazid -ketoconazole -NSAIDS, medicines for pain and inflammation, like ibuprofen or naproxen -phenytoin -rifampin -toxoids -vaccines -warfarin This list may not describe all possible interactions. Give your health care provider a list of all the medicines, herbs, non-prescription drugs, or dietary supplements you use. Also tell them if you smoke, drink alcohol, or use illegal drugs. Some items may interact with your medicine. What should I watch for while using this medicine? Visit your doctor or health care professional for regular checks on your progress. If you are taking this medicine over a prolonged period, carry an identification card with your name and address, the type and dose of your medicine, and your doctor's name and address. This medicine may increase your risk of getting an infection. Tell your doctor or health care professional if you are around anyone with measles or chickenpox, or if you develop  sores or blisters that do not heal properly. If you are going to have surgery, tell your  doctor or health care professional that you have taken this medicine within the last twelve months. Ask your doctor or health care professional about your diet. You may need to lower the amount of salt you eat. This medicine may affect blood sugar levels. If you have diabetes, check with your doctor or health care professional before you change your diet or the dose of your diabetic medicine. What side effects may I notice from receiving this medicine? Side effects that you should report to your doctor or health care professional as soon as possible: -allergic reactions like skin rash, itching or hives, swelling of the face, lips, or tongue -changes in emotions or moods -changes in vision -depressed mood -eye pain -fever or chills, cough, sore throat, pain or difficulty passing urine -increased thirst -swelling of ankles, feet Side effects that usually do not require medical attention (report to your doctor or health care professional if they continue or are bothersome): -confusion, excitement, restlessness -headache -nausea, vomiting -skin problems, acne, thin and shiny skin -trouble sleeping -weight gain This list may not describe all possible side effects. Call your doctor for medical advice about side effects. You may report side effects to FDA at 1-800-FDA-1088. Where should I keep my medicine? Keep out of the reach of children. Store at room temperature between 15 and 30 degrees C (59 and 86 degrees F). Protect from light. Keep container tightly closed. Throw away any unused medicine after the expiration date. NOTE: This sheet is a summary. It may not cover all possible information. If you have questions about this medicine, talk to your doctor, pharmacist, or health care provider.  2014, Elsevier/Gold Standard. (2011-07-13 10:57:14)  Hydroxyzine capsules or tablets What is this medicine? HYDROXYZINE (hye DROX i zeen) is an antihistamine. This medicine is used to treat allergy  symptoms. It is also used to treat anxiety and tension. This medicine can be used with other medicines to induce sleep before surgery. This medicine may be used for other purposes; ask your health care provider or pharmacist if you have questions. COMMON BRAND NAME(S): ANX , Atarax, Rezine, Vistaril What should I tell my health care provider before I take this medicine? They need to know if you have any of these conditions: -any chronic illness -difficulty passing urine -glaucoma -heart disease -kidney disease -liver disease -lung disease -an unusual or allergic reaction to hydroxyzine, cetirizine, other medicines, foods, dyes, or preservatives -pregnant or trying to get pregnant -breast-feeding How should I use this medicine? Take this medicine by mouth with a full glass of water. Follow the directions on the prescription label. You may take this medicine with food or on an empty stomach. Take your medicine at regular intervals. Do not take your medicine more often than directed. Talk to your pediatrician regarding the use of this medicine in children. Special care may be needed. While this drug may be prescribed for children as young as 56 years of age for selected conditions, precautions do apply. Patients over 41 years old may have a stronger reaction and need a smaller dose. Overdosage: If you think you have taken too much of this medicine contact a poison control center or emergency room at once. NOTE: This medicine is only for you. Do not share this medicine with others. What if I miss a dose? If you miss a dose, take it as soon as you can. If it is almost  time for your next dose, take only that dose. Do not take double or extra doses. What may interact with this medicine? -alcohol -barbiturate medicines for sleep or seizures -medicines for colds, allergies -medicines for depression, anxiety, or emotional disturbances -medicines for pain -medicines for sleep -muscle  relaxants This list may not describe all possible interactions. Give your health care provider a list of all the medicines, herbs, non-prescription drugs, or dietary supplements you use. Also tell them if you smoke, drink alcohol, or use illegal drugs. Some items may interact with your medicine. What should I watch for while using this medicine? Tell your doctor or health care professional if your symptoms do not improve. You may get drowsy or dizzy. Do not drive, use machinery, or do anything that needs mental alertness until you know how this medicine affects you. Do not stand or sit up quickly, especially if you are an older patient. This reduces the risk of dizzy or fainting spells. Alcohol may interfere with the effect of this medicine. Avoid alcoholic drinks. Your mouth may get dry. Chewing sugarless gum or sucking hard candy, and drinking plenty of water may help. Contact your doctor if the problem does not go away or is severe. This medicine may cause dry eyes and blurred vision. If you wear contact lenses you may feel some discomfort. Lubricating drops may help. See your eye doctor if the problem does not go away or is severe. If you are receiving skin tests for allergies, tell your doctor you are using this medicine. What side effects may I notice from receiving this medicine? Side effects that you should report to your doctor or health care professional as soon as possible: -fast or irregular heartbeat -difficulty passing urine -seizures -slurred speech or confusion -tremor Side effects that usually do not require medical attention (report to your doctor or health care professional if they continue or are bothersome): -constipation -drowsiness -fatigue -headache -stomach upset This list may not describe all possible side effects. Call your doctor for medical advice about side effects. You may report side effects to FDA at 1-800-FDA-1088. Where should I keep my medicine? Keep out of  the reach of children. Store at room temperature between 15 and 30 degrees C (59 and 86 degrees F). Keep container tightly closed. Throw away any unused medicine after the expiration date. NOTE: This sheet is a summary. It may not cover all possible information. If you have questions about this medicine, talk to your doctor, pharmacist, or health care provider.  2014, Elsevier/Gold Standard. (2008-04-10 14:50:59)  Lorazepam tablets What is this medicine? LORAZEPAM (lor A ze pam) is a benzodiazepine. It is used to treat anxiety. This medicine may be used for other purposes; ask your health care provider or pharmacist if you have questions. COMMON BRAND NAME(S): Ativan What should I tell my health care provider before I take this medicine? They need to know if you have any of these conditions: -alcohol or drug abuse problem -bipolar disorder, depression, psychosis or other mental health condition -glaucoma -kidney or liver disease -lung disease or breathing difficulties -myasthenia gravis -Parkinson's disease -seizures or a history of seizures -suicidal thoughts -an unusual or allergic reaction to lorazepam, other benzodiazepines, foods, dyes, or preservatives -pregnant or trying to get pregnant -breast-feeding How should I use this medicine? Take this medicine by mouth with a glass of water. Follow the directions on the prescription label. If it upsets your stomach, take it with food or milk. Take your medicine at regular intervals.  Do not take it more often than directed. Do not stop taking except on the advice of your doctor or health care professional. Talk to your pediatrician regarding the use of this medicine in children. Special care may be needed. Overdosage: If you think you have taken too much of this medicine contact a poison control center or emergency room at once. NOTE: This medicine is only for you. Do not share this medicine with others. What if I miss a dose? If you  miss a dose, take it as soon as you can. If it is almost time for your next dose, take only that dose. Do not take double or extra doses. What may interact with this medicine? -barbiturate medicines for inducing sleep or treating seizures, like phenobarbital -clozapine -medicines for depression, mental problems or psychiatric disturbances -medicines for sleep -phenytoin -probenecid -theophylline -valproic acid This list may not describe all possible interactions. Give your health care provider a list of all the medicines, herbs, non-prescription drugs, or dietary supplements you use. Also tell them if you smoke, drink alcohol, or use illegal drugs. Some items may interact with your medicine. What should I watch for while using this medicine? Visit your doctor or health care professional for regular checks on your progress. Your body may become dependent on this medicine, ask your doctor or health care professional if you still need to take it. However, if you have been taking this medicine regularly for some time, do not suddenly stop taking it. You must gradually reduce the dose or you may get severe side effects. Ask your doctor or health care professional for advice before increasing or decreasing the dose. Even after you stop taking this medicine it can still affect your body for several days. You may get drowsy or dizzy. Do not drive, use machinery, or do anything that needs mental alertness until you know how this medicine affects you. To reduce the risk of dizzy and fainting spells, do not stand or sit up quickly, especially if you are an older patient. Alcohol may increase dizziness and drowsiness. Avoid alcoholic drinks. Do not treat yourself for coughs, colds or allergies without asking your doctor or health care professional for advice. Some ingredients can increase possible side effects. What side effects may I notice from receiving this medicine? Side effects that you should report to  your doctor or health care professional as soon as possible: -changes in vision -confusion -depression -mood changes, excitability or aggressive behavior -movement difficulty, staggering or jerky movements -muscle cramps -restlessness -weakness or tiredness Side effects that usually do not require medical attention (report to your doctor or health care professional if they continue or are bothersome): -constipation or diarrhea -difficulty sleeping, nightmares -dizziness, drowsiness -headache -nausea, vomiting This list may not describe all possible side effects. Call your doctor for medical advice about side effects. You may report side effects to FDA at 1-800-FDA-1088. Where should I keep my medicine? Keep out of the reach of children. This medicine can be abused. Keep your medicine in a safe place to protect it from theft. Do not share this medicine with anyone. Selling or giving away this medicine is dangerous and against the law. Store at room temperature between 20 and 25 degrees C (68 and 77 degrees F). Protect from light. Keep container tightly closed. Throw away any unused medicine after the expiration date. NOTE: This sheet is a summary. It may not cover all possible information. If you have questions about this medicine, talk to your doctor,  pharmacist, or health care provider.  2014, Elsevier/Gold Standard. (2008-05-29 14:58:20)

## 2014-03-11 ENCOUNTER — Telehealth: Payer: Self-pay

## 2014-03-11 ENCOUNTER — Other Ambulatory Visit: Payer: Self-pay

## 2014-03-11 DIAGNOSIS — K859 Acute pancreatitis without necrosis or infection, unspecified: Secondary | ICD-10-CM

## 2014-03-11 NOTE — Telephone Encounter (Signed)
Pt scheduled for 04/02/14 830 am EUS instructions mailed to the home, Left message on machine to call back

## 2014-03-11 NOTE — Telephone Encounter (Signed)
Message copied by Donata DuffLEWIS, Williette Loewe L on Wed Mar 11, 2014  8:05 AM ------      Message from: Donata DuffLEWIS, Shaqueta Casady L      Created: Tue Feb 24, 2014  1:47 PM       Anyi Fels, can you please set him up for upper EUS, radial +/- linear, ++MAC, EUS Thursday in mid to late April to allow for any acute pancreatic inflammation to subside prior to the test. ------

## 2014-03-13 ENCOUNTER — Encounter (HOSPITAL_COMMUNITY): Payer: Self-pay | Admitting: Emergency Medicine

## 2014-03-13 ENCOUNTER — Emergency Department (HOSPITAL_COMMUNITY)
Admission: EM | Admit: 2014-03-13 | Discharge: 2014-03-13 | Disposition: A | Discharge status: Home / Self Care | Payer: Self-pay | Attending: Emergency Medicine | Admitting: Emergency Medicine

## 2014-03-13 DIAGNOSIS — G8929 Other chronic pain: Secondary | ICD-10-CM | POA: Insufficient documentation

## 2014-03-13 DIAGNOSIS — F17200 Nicotine dependence, unspecified, uncomplicated: Secondary | ICD-10-CM | POA: Insufficient documentation

## 2014-03-13 DIAGNOSIS — IMO0002 Reserved for concepts with insufficient information to code with codable children: Secondary | ICD-10-CM | POA: Insufficient documentation

## 2014-03-13 DIAGNOSIS — Z79899 Other long term (current) drug therapy: Secondary | ICD-10-CM | POA: Insufficient documentation

## 2014-03-13 DIAGNOSIS — Z87442 Personal history of urinary calculi: Secondary | ICD-10-CM | POA: Insufficient documentation

## 2014-03-13 DIAGNOSIS — R21 Rash and other nonspecific skin eruption: Secondary | ICD-10-CM | POA: Insufficient documentation

## 2014-03-13 MED ORDER — PREDNISONE 5 MG PO TABS: ORAL_TABLET | ORAL | Status: DC

## 2014-03-13 NOTE — ED Notes (Signed)
Pt seen for rash, treated with prednisone, rash getting better but out of med.

## 2014-03-13 NOTE — Discharge Instructions (Signed)
Only start taking the prednisone if the rash gets worse
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
## 2014-03-16 NOTE — Telephone Encounter (Signed)
Left message on machine to call back  

## 2014-03-16 NOTE — Telephone Encounter (Signed)
EUS scheduled, pt instructed and medications reviewed.  Patient instructions mailed to home.  Patient to call with any questions or concerns.  

## 2014-03-26 ENCOUNTER — Encounter (HOSPITAL_COMMUNITY): Payer: Self-pay | Admitting: Pharmacy Technician

## 2014-03-26 ENCOUNTER — Encounter (HOSPITAL_COMMUNITY): Payer: Self-pay | Admitting: *Deleted

## 2014-04-02 ENCOUNTER — Ambulatory Visit (HOSPITAL_COMMUNITY): Payer: Self-pay | Admitting: Anesthesiology

## 2014-04-02 ENCOUNTER — Encounter (HOSPITAL_COMMUNITY): Payer: Self-pay | Admitting: Anesthesiology

## 2014-04-02 ENCOUNTER — Encounter (HOSPITAL_COMMUNITY)
Admission: RE | Disposition: A | Discharge status: Home / Self Care | Payer: Self-pay | Source: Ambulatory Visit | Attending: Gastroenterology

## 2014-04-02 ENCOUNTER — Ambulatory Visit (HOSPITAL_COMMUNITY)
Admission: RE | Admit: 2014-04-02 | Discharge: 2014-04-02 | Disposition: A | Discharge status: Home / Self Care | Payer: Self-pay | Source: Ambulatory Visit | Attending: Gastroenterology | Admitting: Gastroenterology

## 2014-04-02 DIAGNOSIS — R21 Rash and other nonspecific skin eruption: Secondary | ICD-10-CM | POA: Insufficient documentation

## 2014-04-02 DIAGNOSIS — F17200 Nicotine dependence, unspecified, uncomplicated: Secondary | ICD-10-CM | POA: Insufficient documentation

## 2014-04-02 DIAGNOSIS — R109 Unspecified abdominal pain: Secondary | ICD-10-CM | POA: Insufficient documentation

## 2014-04-02 DIAGNOSIS — K2990 Gastroduodenitis, unspecified, without bleeding: Secondary | ICD-10-CM

## 2014-04-02 DIAGNOSIS — R933 Abnormal findings on diagnostic imaging of other parts of digestive tract: Secondary | ICD-10-CM

## 2014-04-02 DIAGNOSIS — K2970 Gastritis, unspecified, without bleeding: Secondary | ICD-10-CM

## 2014-04-02 DIAGNOSIS — R935 Abnormal findings on diagnostic imaging of other abdominal regions, including retroperitoneum: Secondary | ICD-10-CM | POA: Insufficient documentation

## 2014-04-02 DIAGNOSIS — K859 Acute pancreatitis without necrosis or infection, unspecified: Secondary | ICD-10-CM

## 2014-04-02 DIAGNOSIS — K319 Disease of stomach and duodenum, unspecified: Secondary | ICD-10-CM | POA: Insufficient documentation

## 2014-04-02 DIAGNOSIS — K2980 Duodenitis without bleeding: Secondary | ICD-10-CM | POA: Insufficient documentation

## 2014-04-02 HISTORY — PX: EUS: SHX5427

## 2014-04-02 LAB — COMPREHENSIVE METABOLIC PANEL
ALT: 28 U/L (ref 0–53)
AST: 22 U/L (ref 0–37)
Albumin: 3.6 g/dL (ref 3.5–5.2)
Alkaline Phosphatase: 68 U/L (ref 39–117)
BUN: 14 mg/dL (ref 6–23)
CALCIUM: 8.6 mg/dL (ref 8.4–10.5)
CO2: 26 mEq/L (ref 19–32)
Chloride: 104 mEq/L (ref 96–112)
Creatinine, Ser: 1.04 mg/dL (ref 0.50–1.35)
GFR calc Af Amer: >90 mL/min (ref >90)
GFR, EST NON AFRICAN AMERICAN: 89 mL/min — AB (ref >90)
Glucose, Bld: 124 mg/dL — ABNORMAL HIGH (ref 70–99)
Potassium: 4.5 mEq/L (ref 3.7–5.3)
SODIUM: 139 meq/L (ref 137–147)
TOTAL PROTEIN: 6.7 g/dL (ref 6.0–8.3)
Total Bilirubin: <0.2 mg/dL — ABNORMAL LOW (ref 0.3–1.2)

## 2014-04-02 LAB — LIPASE, BLOOD: Lipase: 16 U/L (ref 11–59)

## 2014-04-02 SURGERY — UPPER ENDOSCOPIC ULTRASOUND (EUS) LINEAR
Anesthesia: Monitor Anesthesia Care

## 2014-04-02 MED ORDER — PROPOFOL 10 MG/ML IV BOLUS
INTRAVENOUS | Status: AC
  Filled 2014-04-02: qty 20

## 2014-04-02 MED ORDER — PROPOFOL 10 MG/ML IV BOLUS
INTRAVENOUS | Status: DC | PRN
  Administered 2014-04-02: 20 mg via INTRAVENOUS
  Administered 2014-04-02: 50 mg via INTRAVENOUS
  Administered 2014-04-02 (×2): 20 mg via INTRAVENOUS

## 2014-04-02 MED ORDER — PROPOFOL INFUSION 10 MG/ML OPTIME
INTRAVENOUS | Status: DC | PRN
  Administered 2014-04-02: 120 ug/kg/min via INTRAVENOUS

## 2014-04-02 MED ORDER — MIDAZOLAM HCL 5 MG/5ML IJ SOLN
INTRAMUSCULAR | Status: DC | PRN
  Administered 2014-04-02: 2 mg via INTRAVENOUS

## 2014-04-02 MED ORDER — MIDAZOLAM HCL 2 MG/2ML IJ SOLN
INTRAMUSCULAR | Status: AC
  Filled 2014-04-02: qty 2

## 2014-04-02 MED ORDER — SODIUM CHLORIDE 0.9 % IV SOLN: INTRAVENOUS | Status: DC

## 2014-04-02 MED ORDER — BUTAMBEN-TETRACAINE-BENZOCAINE 2-2-14 % EX AERO
INHALATION_SPRAY | CUTANEOUS | Status: DC | PRN
  Administered 2014-04-02: 2 via TOPICAL

## 2014-04-02 MED ORDER — LACTATED RINGERS IV SOLN
INTRAVENOUS | Status: DC
  Administered 2014-04-02: 08:00:00 via INTRAVENOUS

## 2014-04-02 NOTE — Discharge Instructions (Signed)

## 2014-04-02 NOTE — Anesthesia Preprocedure Evaluation (Addendum)
Anesthesia Evaluation  Patient identified by MRN, date of birth, ID band Patient awake    Reviewed: Allergy & Precautions, H&P , NPO status , Patient's Chart, lab work & pertinent test results  Airway Mallampati: II TM Distance: >3 FB Neck ROM: Full    Dental no notable dental hx.    Pulmonary neg pulmonary ROS, Current Smoker,  breath sounds clear to auscultation  Pulmonary exam normal       Cardiovascular negative cardio ROS  Rhythm:Regular Rate:Normal     Neuro/Psych  Headaches, Chronic headaches negative neurological ROS  negative psych ROS   GI/Hepatic negative GI ROS, Neg liver ROS,   Endo/Other  negative endocrine ROS  Renal/GU Renal diseasenegative Renal ROS  negative genitourinary   Musculoskeletal negative musculoskeletal ROS (+)   Abdominal   Peds negative pediatric ROS (+)  Hematology negative hematology ROS (+)   Anesthesia Other Findings   Reproductive/Obstetrics negative OB ROS                          Anesthesia Physical Anesthesia Plan  ASA: II  Anesthesia Plan: MAC   Post-op Pain Management:    Induction: Intravenous  Airway Management Planned:   Additional Equipment:   Intra-op Plan:   Post-operative Plan:   Informed Consent: I have reviewed the patients History and Physical, chart, labs and discussed the procedure including the risks, benefits and alternatives for the proposed anesthesia with the patient or authorized representative who has indicated his/her understanding and acceptance.   Dental advisory given  Plan Discussed with: CRNA  Anesthesia Plan Comments:         Anesthesia Quick Evaluation
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
## 2014-04-02 NOTE — Op Note (Signed)
[facility] [address] [place], [id number]   ENDOSCOPIC ULTRASOUND PROCEDURE REPORT  PATIENT: Bradley SohoWatkins, Mustaf A.  MR#: [id number] BIRTHDATE: 07-27-1976  GENDER: Male ENDOSCOPIST: Rachael Feeaniel P Hidaya [place], MD REFERRED BY:  Jonette EvaSandi Fields, MD PROCEDURE DATE:  04/02/2014 PROCEDURE:   Upper EUS w/FNA , EGD with biopsy ASA CLASS:      Class II INDICATIONS:   recent mild acute pancreatitis (lipase 700s, normal cmet), former EToh drinker, no stones in GB on US; rash started around the same time. MEDICATIONS: MAC sedation, administered by CRNA  DESCRIPTION OF PROCEDURE:   After the risks benefits and alternatives of the procedure were  explained, informed consent was obtained. The patient was then placed in the left, lateral, decubitus postion and IV sedation was administered. Throughout the procedure, the patients blood pressure, pulse and oxygen saturations were monitored continuously.  Under direct visualization, the Pentax Radial EUS [id number]  endoscope was introduced through the mouth  and advanced to the second portion of the duodenum .  Water was used as necessary to provide an acoustic interface.  Upon completion of the imaging, water was removed and the patient was sent to the recovery room in satisfactory condition.  Endoscopic findings: 1. There was mild gastritis and duodenitis, biospies taken from distal stomach to check for H. pylori 2. Major papilla was protuberent but not neoplastic appearing (viewed with ERCP scope). EUS findings: 1. The pancreatic parenchyma was abnormal throughout the gland (more hypoechoic, lobular appearing than normal). There were no discrete lesions, masses.  The parenchyma in body of the gland was sampled with two transgastric FNA passes using a 19 gauge FNA needle. 2. The CBD was difficult to visualize in pancreatic head (?strictured) and was 7.5cm proximally. There were no clear stones in the bile duct. 3. Main pancreatic duct was  normal throughout. 4. No peripancreatic or celiac adenopathy. 5. Limited views of liver, spleen, portal and splenic vessels were all normal Impression: The entire pancreas was abnormal appearing, this could be from ongoing inflammation and FNA was taken from the body of the gland to check for inflammation and neoplasm.  The CBD does appear dilated proximal to interface with the pancreas.  His rash started around the same time as his abd pain, pancreatitis symptoms and perhaps there is underlying (systemic) explanation.  Await final cytology, I also had labs drawn for IgG4, CMET, lipase today.  Also biopsies taken from gastritis to check for H. pylori.   _______________________________eSigned:  Rachael Feeaniel P Braden Cimo, MD 04/02/2014 9:52 AM

## 2014-04-02 NOTE — Transfer of Care (Signed)
Immediate Anesthesia Transfer of Care Note  Patient: Bradley Garner  Procedure(s) Performed: Procedure(s): UPPER ENDOSCOPIC ULTRASOUND (EUS) LINEAR (N/A)  Patient Location: PACU  Anesthesia Type:MAC  Level of Consciousness: sedated  Airway & Oxygen Therapy: Patient Spontanous Breathing and Patient connected to nasal cannula oxygen  Post-op Assessment: Report given to PACU RN and Post -op Vital signs reviewed and stable  Post vital signs: stable  Complications: No apparent anesthesia complications

## 2014-04-02 NOTE — Anesthesia Postprocedure Evaluation (Signed)
  Anesthesia Post-op Note  Patient: Bradley Garner  Procedure(s) Performed: Procedure(s) (LRB): UPPER ENDOSCOPIC ULTRASOUND (EUS) LINEAR (N/A)  Patient Location: PACU  Anesthesia Type: MAC  Level of Consciousness: awake and alert   Airway and Oxygen Therapy: Patient Spontanous Breathing  Post-op Pain: mild  Post-op Assessment: Post-op Vital signs reviewed, Patient's Cardiovascular Status Stable, Respiratory Function Stable, Patent Airway and No signs of Nausea or vomiting  Last Vitals:  Filed Vitals:   04/02/14 1050  BP: 126/92  Pulse:   Temp:   Resp: 15    Post-op Vital Signs: stable   Complications: No apparent anesthesia complications

## 2014-04-03 ENCOUNTER — Encounter (HOSPITAL_COMMUNITY): Payer: Self-pay | Admitting: Gastroenterology

## 2014-04-07 LAB — IGG: IgG (Immunoglobin G), Serum: 1220 mg/dL (ref 694–1618)

## 2014-04-07 LAB — IGG 1, 2, 3, AND 4
IGG 4: 3.3
IGG, SUBCLASS 1: 816
IGG, SUBCLASS 2: 264
IgG, Subclass 3: 167

## 2014-04-14 ENCOUNTER — Emergency Department (HOSPITAL_COMMUNITY)
Admission: EM | Admit: 2014-04-14 | Discharge: 2014-04-14 | Disposition: A | Discharge status: Home / Self Care | Payer: Self-pay | Attending: Emergency Medicine | Admitting: Emergency Medicine

## 2014-04-14 ENCOUNTER — Encounter (HOSPITAL_COMMUNITY): Payer: Self-pay | Admitting: Emergency Medicine

## 2014-04-14 DIAGNOSIS — G8929 Other chronic pain: Secondary | ICD-10-CM | POA: Insufficient documentation

## 2014-04-14 DIAGNOSIS — F17200 Nicotine dependence, unspecified, uncomplicated: Secondary | ICD-10-CM | POA: Insufficient documentation

## 2014-04-14 DIAGNOSIS — Z8781 Personal history of (healed) traumatic fracture: Secondary | ICD-10-CM | POA: Insufficient documentation

## 2014-04-14 DIAGNOSIS — Z87442 Personal history of urinary calculi: Secondary | ICD-10-CM | POA: Insufficient documentation

## 2014-04-14 DIAGNOSIS — R21 Rash and other nonspecific skin eruption: Secondary | ICD-10-CM | POA: Insufficient documentation

## 2014-04-14 MED ORDER — HYDROXYZINE PAMOATE 25 MG PO CAPS: 25.0000 mg | ORAL_CAPSULE | Freq: Four times a day (QID) | ORAL | Status: DC | PRN

## 2014-04-14 MED ORDER — DEXAMETHASONE 4 MG PO TABS
12.0000 mg | ORAL_TABLET | Freq: Once | ORAL | Status: AC
  Administered 2014-04-14: 12 mg via ORAL
  Filled 2014-04-14: qty 3
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
## 2014-04-14 NOTE — ED Notes (Signed)
Pt reports he needs another prescription for a cream he had.  Denies any other concerns at present time.

## 2014-04-14 NOTE — Discharge Instructions (Signed)
The cause for your rash is not clear. However, since it seems to be improving, I feel that the risk of additional prednisone is not worth what can be gained from it. Please followup with your gastroenterologist. He still may need to be evaluated by a dermatologist. In the meantime, take your Allegra twice a day. You may take hydroxyzine as needed for itching that is not controlled with Allegra.

## 2014-04-22 ENCOUNTER — Telehealth: Payer: Self-pay | Admitting: General Practice

## 2014-04-22 NOTE — Telephone Encounter (Signed)
[phone](CELL)  Patient called and stated he had [facility] with Dr. Christella HartiganJacobs, however he's getting calls [facility] stating he has an appt with them on 5/18 at 2:30 for a new patient consult, RE: PANCREATITIS.  Patient would like to know if he needs to keep that app[facility].

## 2014-04-22 NOTE — Telephone Encounter (Signed)
PLEASE CALL PT. HE HAS COMPLETED HIS EUS. 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 HE DOES NOT NEED TO GO TO [facility]. HE SHOULD AVOID ALCOHOL AND TOBACCO PRODUCTS. HE SHOULD STRICTLY FOLLOW A LOW FAT DIET. IF HE DOES NOT HAVE INSURANCE COVERAGE FOR MEDS HE NEEDS TO FILL OUT PT ASSISTANCE PAPERWORK FOR CREON WHICH ARE PANCREAS ENZYMES. CREON WILL HELP REDUCE FLARES AND CONTROL PAIN. HE CAN COM EBY THE OFFICE AND PICKUP SOME SAMPLES #50 AND TAKE 2 WITH MEALS AND 1 WITH SNACKS.

## 2014-04-23 NOTE — Telephone Encounter (Signed)
LATE ENTRY: I called pt on 04/22/2014 and informed him and left his Creon samples at front for pick up.

## 2019-10-10 ENCOUNTER — Other Ambulatory Visit: Payer: Self-pay

## 2019-10-10 DIAGNOSIS — Z20822 Contact with and (suspected) exposure to covid-19: Secondary | ICD-10-CM

## 2019-10-11 LAB — NOVEL CORONAVIRUS, NAA: SARS-CoV-2, NAA: NOT DETECTED

## 2019-12-17 ENCOUNTER — Ambulatory Visit: Payer: Self-pay | Attending: Internal Medicine

## 2019-12-17 ENCOUNTER — Other Ambulatory Visit: Payer: Self-pay

## 2019-12-17 DIAGNOSIS — Z20822 Contact with and (suspected) exposure to covid-19: Secondary | ICD-10-CM | POA: Insufficient documentation

## 2019-12-18 LAB — NOVEL CORONAVIRUS, NAA: SARS-CoV-2, NAA: NOT DETECTED

## 2021-02-14 ENCOUNTER — Encounter: Payer: Self-pay | Admitting: Emergency Medicine

## 2021-02-14 ENCOUNTER — Ambulatory Visit
Admission: EM | Admit: 2021-02-14 | Discharge: 2021-02-14 | Disposition: A | Discharge status: Home / Self Care | Payer: BC Managed Care – PPO | Attending: Emergency Medicine | Admitting: Emergency Medicine

## 2021-02-14 ENCOUNTER — Ambulatory Visit (INDEPENDENT_AMBULATORY_CARE_PROVIDER_SITE_OTHER): Payer: BC Managed Care – PPO

## 2021-02-14 ENCOUNTER — Other Ambulatory Visit: Payer: Self-pay

## 2021-02-14 DIAGNOSIS — W25XXXA Contact with sharp glass, initial encounter: Secondary | ICD-10-CM

## 2021-02-14 DIAGNOSIS — M795 Residual foreign body in soft tissue: Secondary | ICD-10-CM

## 2021-02-14 DIAGNOSIS — M25522 Pain in left elbow: Secondary | ICD-10-CM | POA: Diagnosis not present

## 2021-02-14 MED ORDER — CEPHALEXIN 500 MG PO CAPS: 500.0000 mg | ORAL_CAPSULE | Freq: Four times a day (QID) | ORAL | 0 refills | Status: DC
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
## 2021-02-14 NOTE — Discharge Instructions (Addendum)
Foreign body was removed Keep wound clean with warm water and mild soap Apply thin layer of Neosporin Keflex was prescribed/take as directed Follow with PCP Return or go to ED if you develop any new or worsening  of your symptoms
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
## 2021-07-24 IMAGING — DX DG ELBOW COMPLETE 3+V*L*
4 series · 4 of 4 positions shown · non-contrast
Comparison: None.

CLINICAL DATA: Possible glass in the elbow

EXAM:
LEFT ELBOW - COMPLETE 3+ VIEW

[elbow ap]
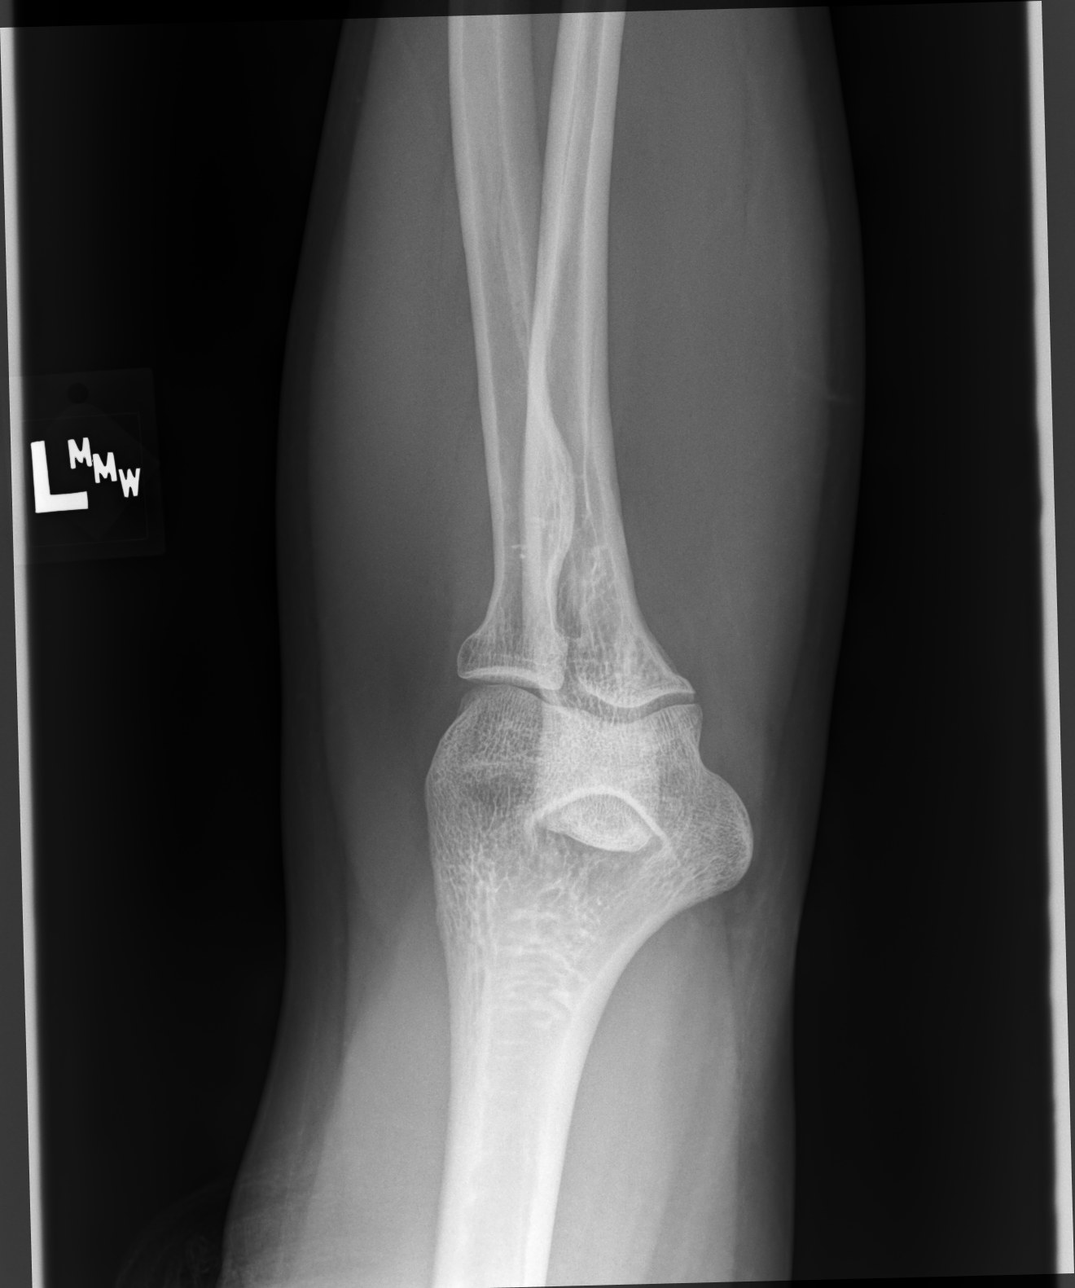

[elbow lmo (1 of 2)]
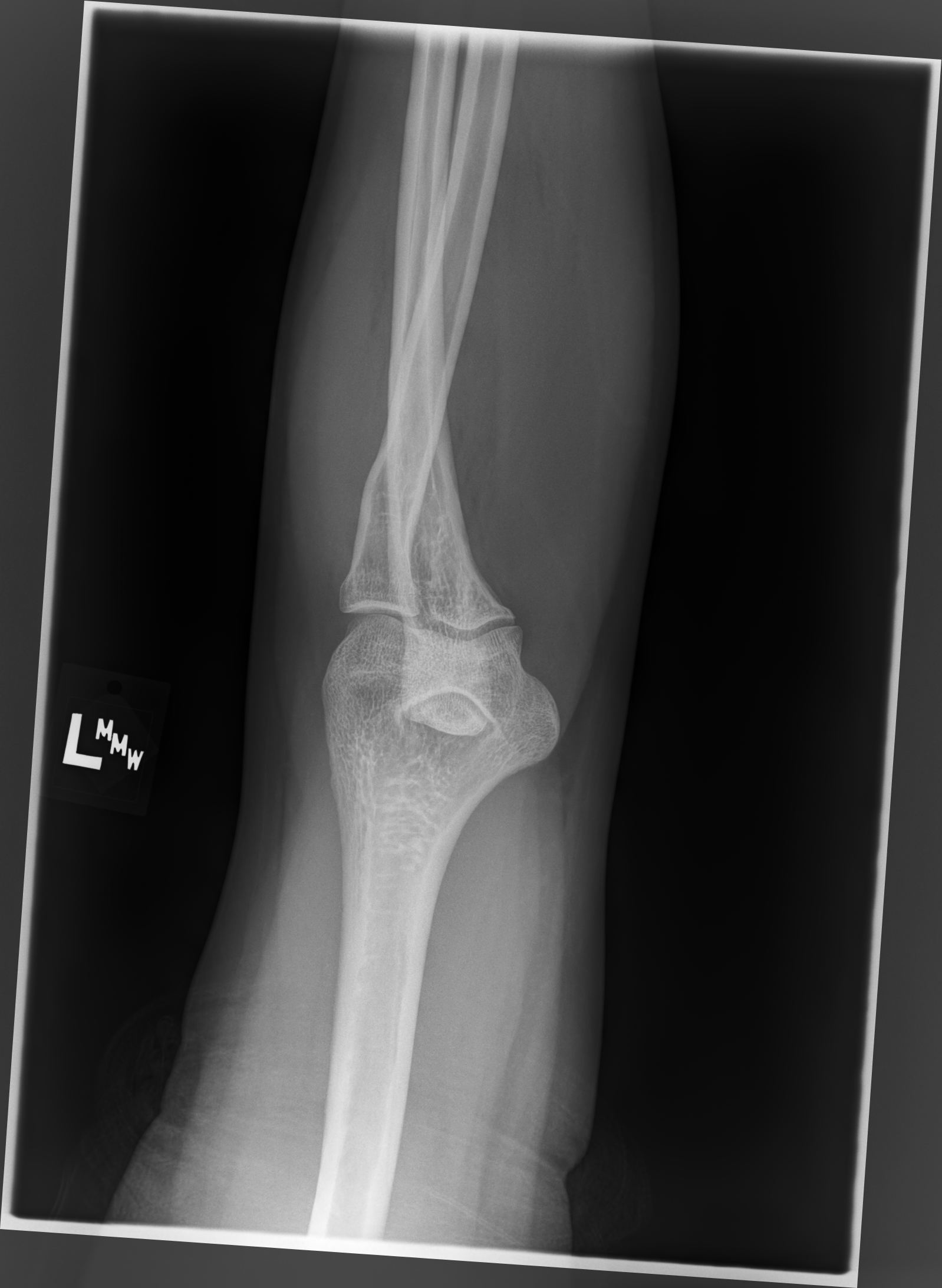

[elbow lmo (2 of 2)]
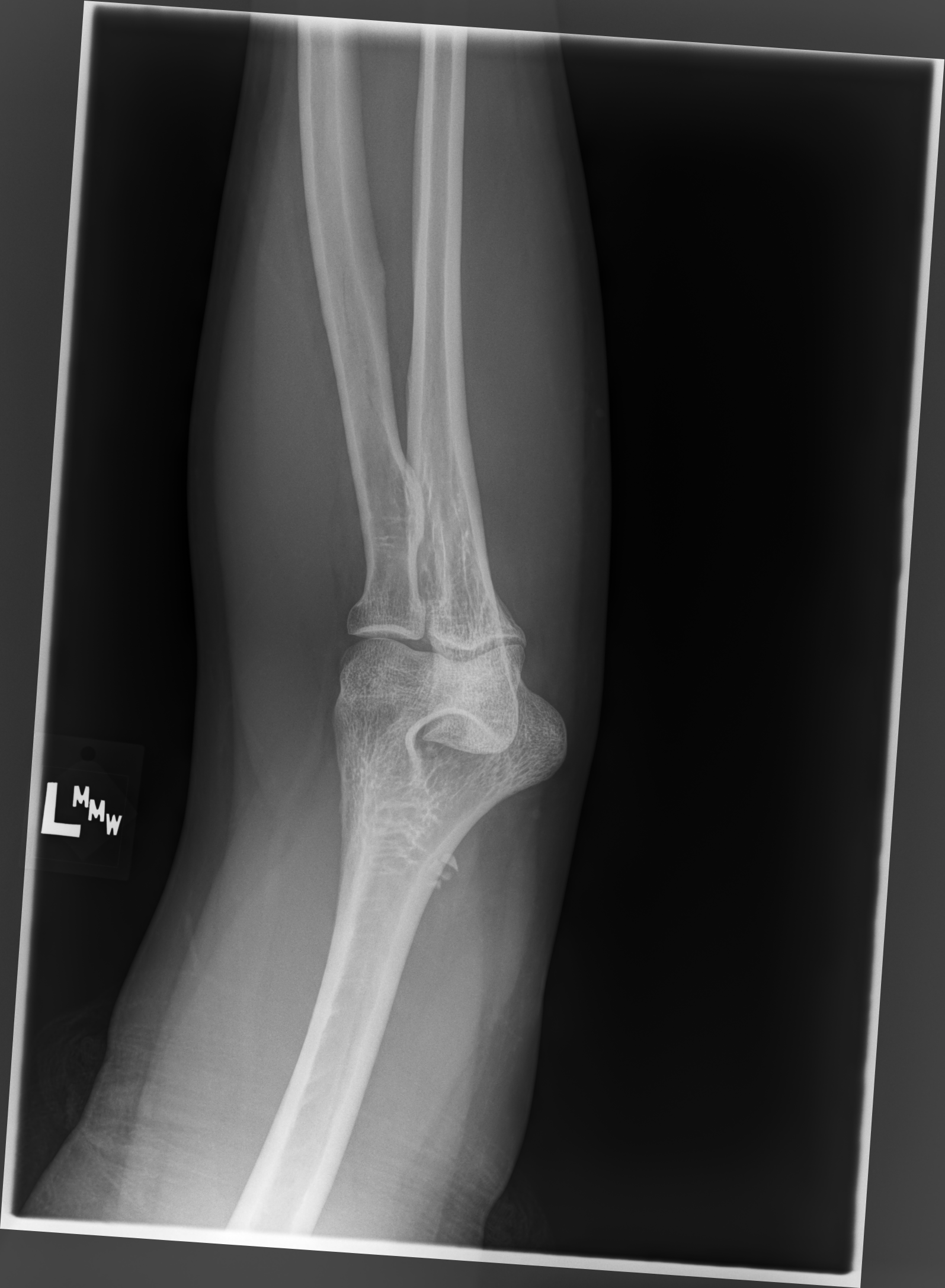

[elbow lat]
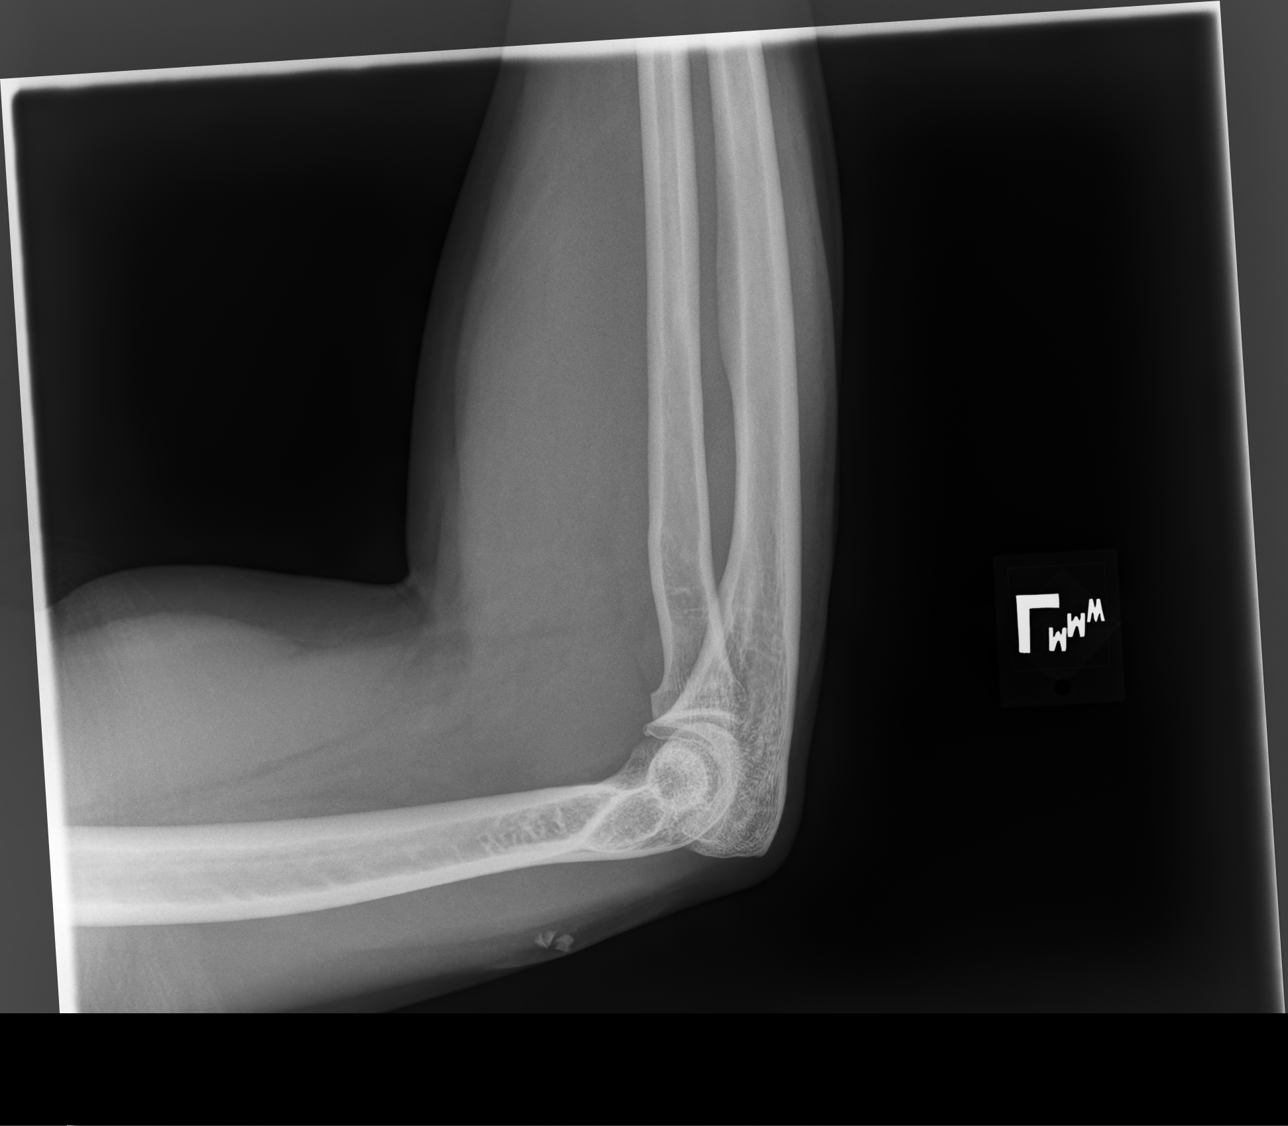

[4 of 4 positions shown; findings below may reference images not displayed]

FINDINGS: There are superficial radiopaque foreign bodies within the dorsal
tissues of the distal left arm, approximately 3.5 cm above the
olecranon when the elbow is in flexed position.
IMPRESSION: Superficial radiopaque foreign bodies within the dorsal left arm,
approximately 3.5 cm proximal to the olecranon process.

## 2022-02-02 ENCOUNTER — Other Ambulatory Visit: Payer: Self-pay

## 2022-02-02 ENCOUNTER — Encounter: Payer: Self-pay | Admitting: Emergency Medicine

## 2022-02-02 ENCOUNTER — Ambulatory Visit
Admission: EM | Admit: 2022-02-02 | Discharge: 2022-02-02 | Disposition: A | Discharge status: Home / Self Care | Payer: BC Managed Care – PPO | Attending: Urgent Care | Admitting: Urgent Care

## 2022-02-02 DIAGNOSIS — J018 Other acute sinusitis: Secondary | ICD-10-CM | POA: Diagnosis not present

## 2022-02-02 DIAGNOSIS — J309 Allergic rhinitis, unspecified: Secondary | ICD-10-CM

## 2022-02-02 MED ORDER — AMOXICILLIN 875 MG PO TABS: 875.0000 mg | ORAL_TABLET | Freq: Two times a day (BID) | ORAL | 0 refills | Status: DC

## 2022-02-02 MED ORDER — PREDNISONE 10 MG PO TABS: 40.0000 mg | ORAL_TABLET | Freq: Every day | ORAL | 0 refills | Status: DC
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
## 2022-02-02 NOTE — ED Triage Notes (Signed)
Nasal congestion since Tuesday.  Facial pain and headaches.  Swollen area under left eye

## 2023-01-18 ENCOUNTER — Ambulatory Visit: Payer: No Typology Code available for payment source

## 2023-01-18 ENCOUNTER — Ambulatory Visit
Admission: EM | Admit: 2023-01-18 | Discharge: 2023-01-18 | Disposition: A | Discharge status: Home / Self Care | Payer: No Typology Code available for payment source | Attending: Nurse Practitioner | Admitting: Nurse Practitioner

## 2023-01-18 DIAGNOSIS — M795 Residual foreign body in soft tissue: Secondary | ICD-10-CM | POA: Diagnosis not present

## 2023-01-18 DIAGNOSIS — M25522 Pain in left elbow: Secondary | ICD-10-CM | POA: Diagnosis not present

## 2023-01-18 MED ORDER — CEPHALEXIN 500 MG PO CAPS: 500.0000 mg | ORAL_CAPSULE | Freq: Two times a day (BID) | ORAL | 0 refills | Status: AC
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
## 2023-01-18 NOTE — Discharge Instructions (Addendum)
Foreign body was removed Keep wound clean with warm water and mild soap Apply thin layer of Neosporin Keflex was prescribed/take as directed Return or go to ED if you develop any new or worsening of your symptoms to include fever, chills, foul-smelling drainage, swelling at the site, or redness that goes up or down the arm. Follow-up as needed.

## 2023-04-23 ENCOUNTER — Ambulatory Visit
Admission: EM | Admit: 2023-04-23 | Discharge: 2023-04-23 | Disposition: A | Discharge status: Home / Self Care | Payer: No Typology Code available for payment source

## 2023-04-23 DIAGNOSIS — R21 Rash and other nonspecific skin eruption: Secondary | ICD-10-CM | POA: Diagnosis not present

## 2023-04-23 MED ORDER — TRIAMCINOLONE ACETONIDE 0.1 % EX OINT: 1.0000 | TOPICAL_OINTMENT | Freq: Two times a day (BID) | CUTANEOUS | 0 refills | Status: AC
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
## 2023-04-23 NOTE — ED Triage Notes (Signed)
Pt states he has a rash on left arm, noted a place on forearm on left wrist that has a few small red bumps that pt states does itch. Tried Cortizone cream with little relief.

## 2023-04-23 NOTE — Discharge Instructions (Signed)
Please use the steroid ointment to help dry up the rash on your left arm.  This should also help with the itching.  In addition to Allegra in the morning, take Benadryl at nighttime as needed for itching.  Seek care for persistent or worsening symptoms despite treatment.

## 2023-05-22 ENCOUNTER — Other Ambulatory Visit: Payer: Self-pay

## 2023-05-22 ENCOUNTER — Telehealth: Payer: Self-pay | Admitting: Emergency Medicine

## 2023-05-22 ENCOUNTER — Ambulatory Visit
Admission: EM | Admit: 2023-05-22 | Discharge: 2023-05-22 | Disposition: A | Discharge status: Home / Self Care | Payer: No Typology Code available for payment source | Attending: Nurse Practitioner | Admitting: Nurse Practitioner

## 2023-05-22 ENCOUNTER — Encounter: Payer: Self-pay | Admitting: Emergency Medicine

## 2023-05-22 DIAGNOSIS — R21 Rash and other nonspecific skin eruption: Secondary | ICD-10-CM | POA: Diagnosis not present

## 2023-05-22 MED ORDER — TRIAMCINOLONE ACETONIDE 0.1 % EX OINT: 1.0000 | TOPICAL_OINTMENT | Freq: Two times a day (BID) | CUTANEOUS | 0 refills | Status: AC

## 2023-05-22 NOTE — Discharge Instructions (Signed)
You can start using the Kenalog ointment for the rash.  Follow up with PCP if rash does not fully go away or improve with this treatment for further evaluation.

## 2023-05-22 NOTE — Telephone Encounter (Signed)
Pt called and reported had similar "spots" on legs and inquired if medication that was prescribed at today's visit could also be used on these sites and if it differed from the cream that previous prescribed. Discussed with pt that today's prescription is an ointment and that is appropriate for these sites as well. Pt verbalized understanding.
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
## 2023-05-22 NOTE — ED Triage Notes (Signed)
Pt reports was seen for rash x1 month ago. Pt reports same type of rash has returned on bilateral arms for last few days and pt is inquiring if can get pill instead of cream that was px last time.
